# Patient Record
Sex: Female | Born: 1935 | Race: White | Hispanic: No | Smoking: Never smoker
Health system: Southern US, Community
[De-identification: ages and names within clinical notes are randomized; demographics above are authoritative.]

## PROBLEM LIST (undated history)

## (undated) DIAGNOSIS — N390 Urinary tract infection, site not specified: Secondary | ICD-10-CM

## (undated) DIAGNOSIS — Z87442 Personal history of urinary calculi: Secondary | ICD-10-CM

## (undated) DIAGNOSIS — M1990 Unspecified osteoarthritis, unspecified site: Secondary | ICD-10-CM

## (undated) DIAGNOSIS — M1710 Unilateral primary osteoarthritis, unspecified knee: Secondary | ICD-10-CM

## (undated) DIAGNOSIS — G20 Parkinson's disease: Secondary | ICD-10-CM

## (undated) DIAGNOSIS — N8110 Cystocele, unspecified: Secondary | ICD-10-CM

## (undated) DIAGNOSIS — K259 Gastric ulcer, unspecified as acute or chronic, without hemorrhage or perforation: Secondary | ICD-10-CM

## (undated) DIAGNOSIS — G43909 Migraine, unspecified, not intractable, without status migrainosus: Secondary | ICD-10-CM

## (undated) DIAGNOSIS — I10 Essential (primary) hypertension: Secondary | ICD-10-CM

## (undated) DIAGNOSIS — N200 Calculus of kidney: Secondary | ICD-10-CM

## (undated) DIAGNOSIS — K5900 Constipation, unspecified: Secondary | ICD-10-CM

## (undated) HISTORY — DX: Cystocele, unspecified: N81.10

## (undated) HISTORY — DX: Essential (primary) hypertension: I10

## (undated) HISTORY — DX: Constipation, unspecified: K59.00

## (undated) HISTORY — DX: Urinary tract infection, site not specified: N39.0

## (undated) HISTORY — PX: CHOLECYSTECTOMY: SHX55

## (undated) HISTORY — PX: BLADDER SURGERY: SHX569

## (undated) HISTORY — DX: Parkinson's disease: G20

## (undated) HISTORY — DX: Calculus of kidney: N20.0

## (undated) HISTORY — DX: Gastric ulcer, unspecified as acute or chronic, without hemorrhage or perforation: K25.9

## (undated) HISTORY — DX: Unspecified osteoarthritis, unspecified site: M19.90

## (undated) HISTORY — DX: Hypercalcemia: E83.52

## (undated) HISTORY — PX: BREAST CYST ASPIRATION: SHX578

## (undated) HISTORY — DX: Unilateral primary osteoarthritis, unspecified knee: M17.10

## (undated) HISTORY — DX: Personal history of urinary calculi: Z87.442

## (undated) HISTORY — DX: Migraine, unspecified, not intractable, without status migrainosus: G43.909

## (undated) HISTORY — PX: PARTIAL HYSTERECTOMY: SHX80

## (undated) HISTORY — PX: KIDNEY STONE SURGERY: SHX686

---

## 1984-10-18 HISTORY — PX: BREAST EXCISIONAL BIOPSY: SUR124

## 2005-01-14 ENCOUNTER — Ambulatory Visit: Payer: Self-pay | Admitting: Internal Medicine

## 2005-12-08 ENCOUNTER — Ambulatory Visit: Payer: Self-pay | Admitting: Internal Medicine

## 2005-12-29 ENCOUNTER — Ambulatory Visit: Payer: Self-pay | Admitting: Internal Medicine

## 2006-02-10 ENCOUNTER — Ambulatory Visit: Payer: Self-pay | Admitting: Internal Medicine

## 2006-02-11 ENCOUNTER — Ambulatory Visit: Payer: Self-pay | Admitting: Internal Medicine

## 2006-02-21 ENCOUNTER — Ambulatory Visit: Payer: Self-pay | Admitting: Internal Medicine

## 2007-02-08 IMAGING — US ULTRASOUND LEFT BREAST
1 series · 15 of 15 positions shown · non-contrast
Comparison: none

REASON FOR EXAM: Mass lesion  US PRN
COMMENTS:

PROCEDURE:     US  - US BREAST LEFT  - February 21, 2006 [DATE]
RESULT:     Comparison is made to the patient's prior mammogram of 02/10/06.
The large LEFT breast mass represents a simple cyst.  No further evaluation
is needed and yearly follow-up mammograms can be obtained.

[Series 1: ultrasound left breast · 15 of 15 slices shown]
[im 1/15]
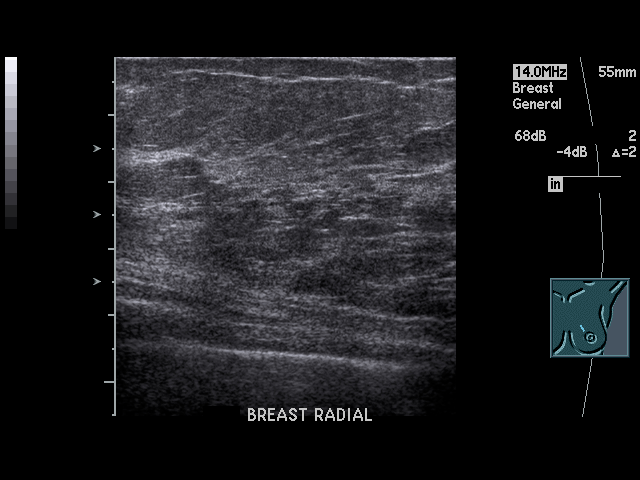
[im 2/15]
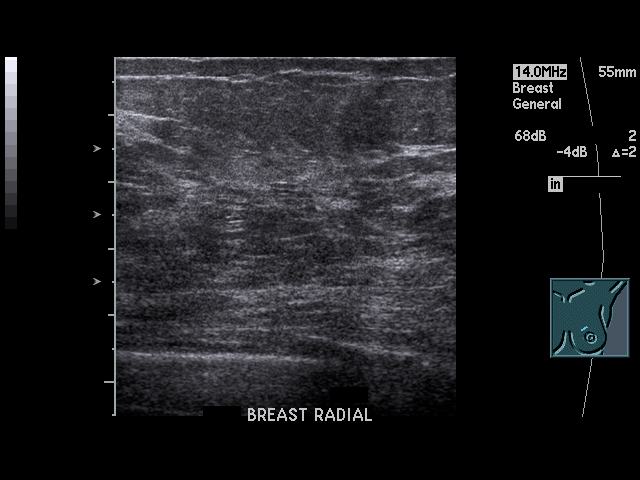
[im 3/15]
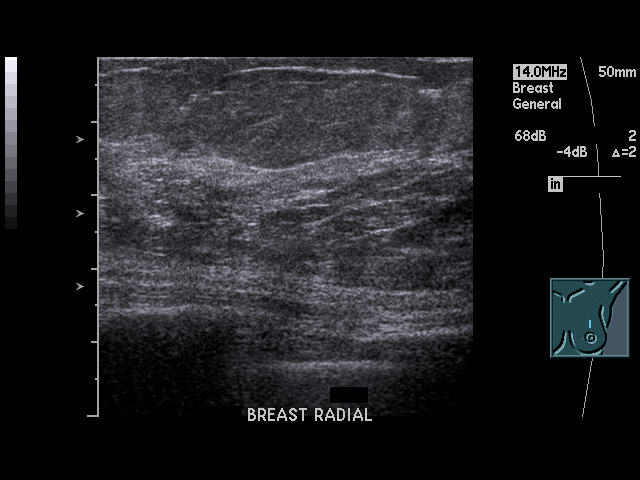
[im 4/15]
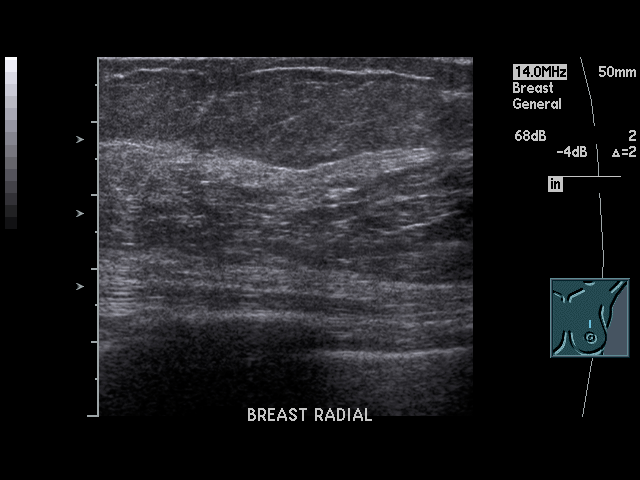
[im 5/15]
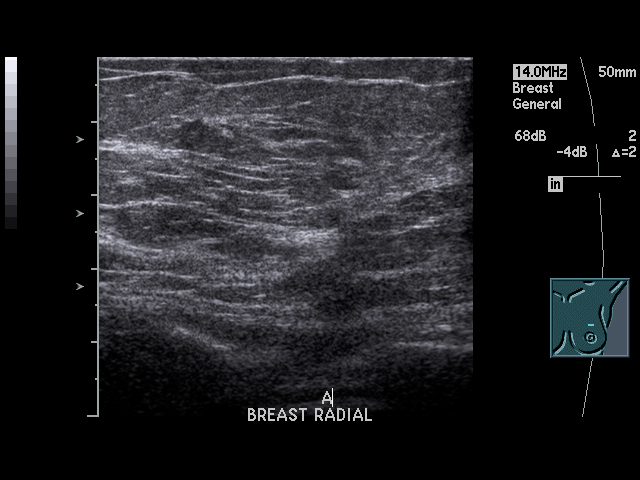
[im 6/15]
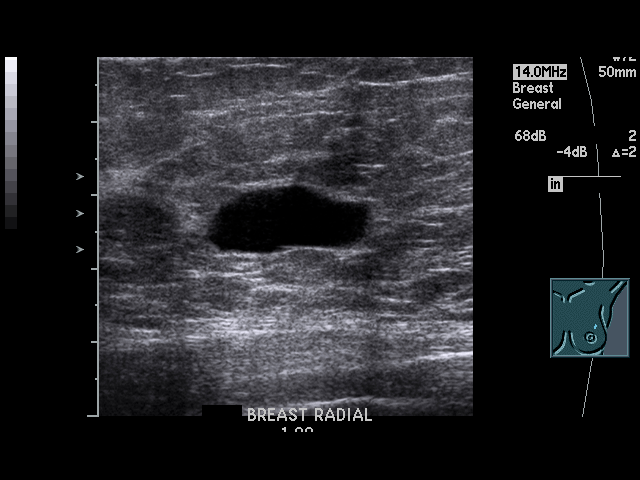
[im 7/15]
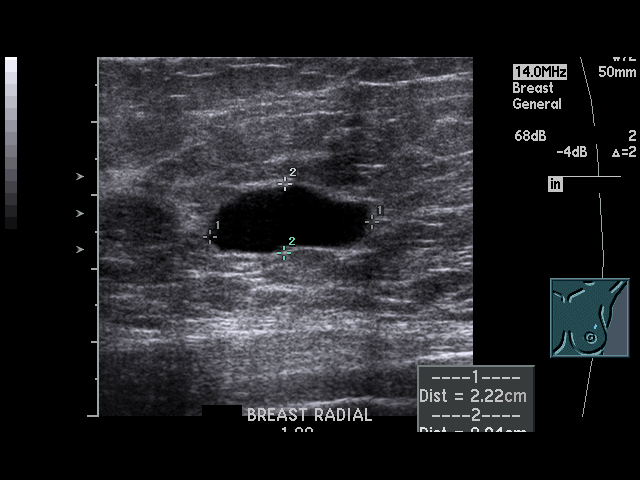
[im 8/15]
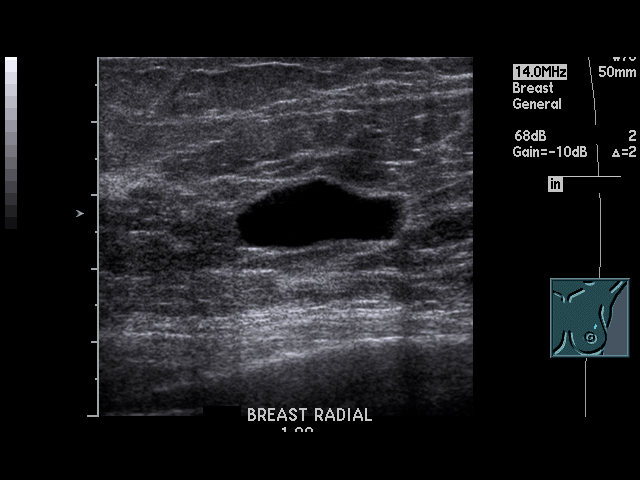
[im 9/15]
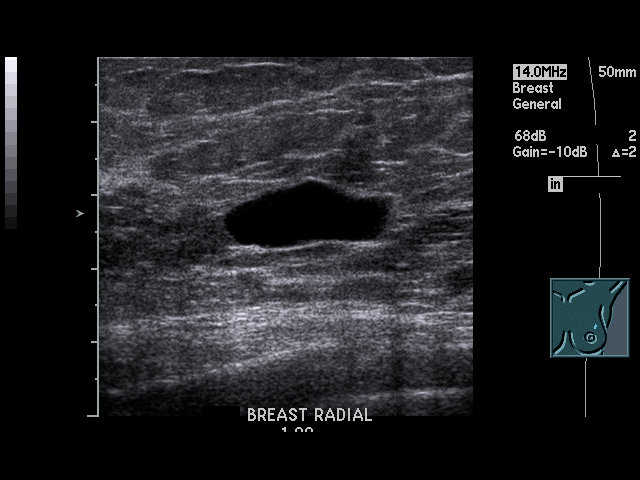
[im 10/15]
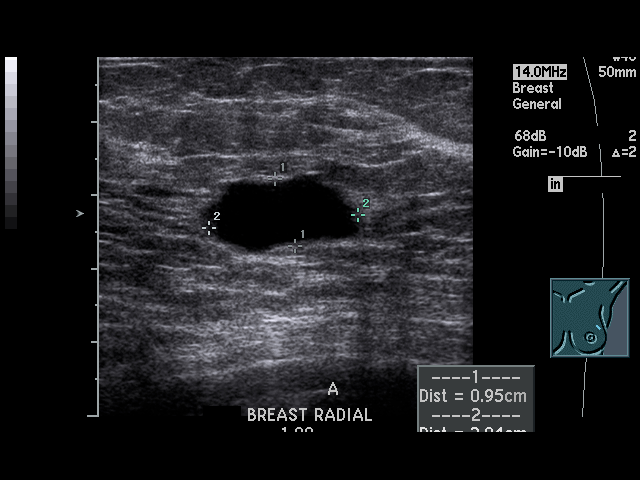
[im 11/15]
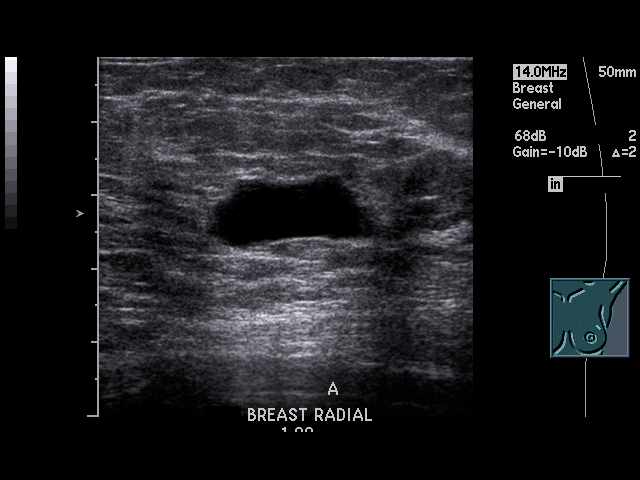
[im 12/15]
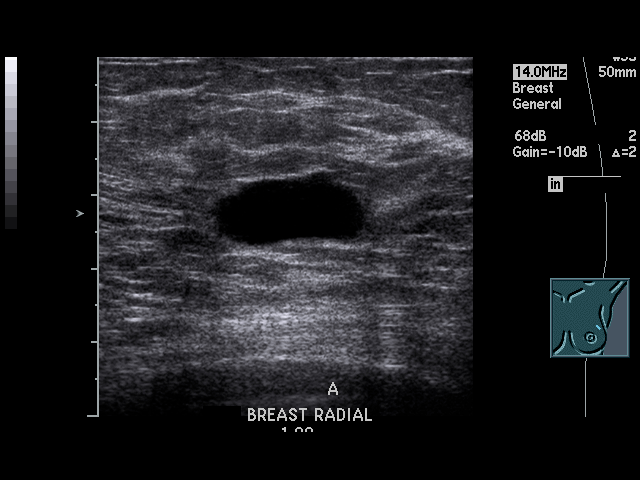
[im 13/15]
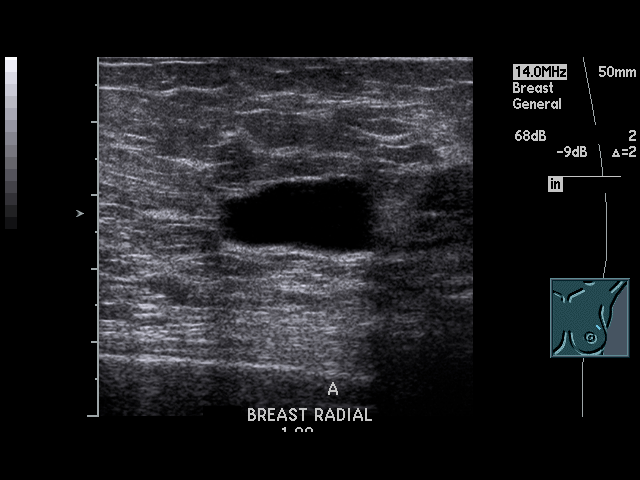
[im 14/15]
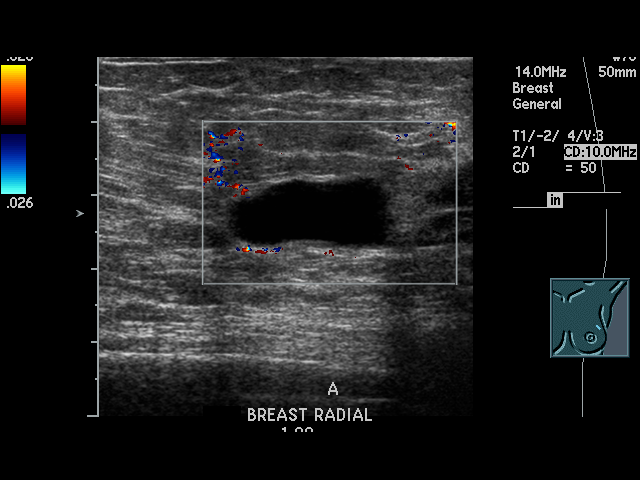
[im 15/15]
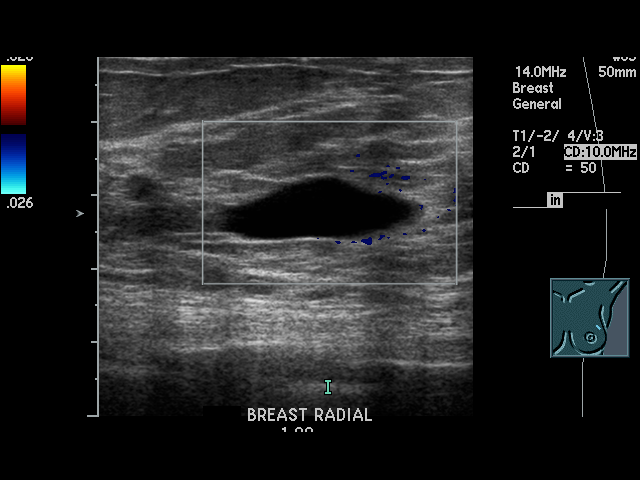

[15 of 15 positions shown; findings below may reference images not displayed]

IMPRESSION: Simple cyst LEFT breast. Yearly follow-up mammogram is suggested.

BI-RADS: Category 2 - Benign Finding.

A NEGATIVE MAMMOGRAM REPORT DOES NOT PRECLUDE BIOPSY OR OTHER EVALUATION OF
A CLINICALLY PALPABLE OR OTHERWISE SUSPICIOUS MASS OR LESION.  BREAST CANCER
MAY NOT BE DETECTED BY MAMMOGRAPHY IN UP TO 10% OF CASES.

## 2007-02-14 ENCOUNTER — Ambulatory Visit: Payer: Self-pay | Admitting: Internal Medicine

## 2007-02-16 ENCOUNTER — Ambulatory Visit: Payer: Self-pay | Admitting: Internal Medicine

## 2007-08-30 ENCOUNTER — Ambulatory Visit: Payer: Self-pay | Admitting: Internal Medicine

## 2007-09-01 ENCOUNTER — Ambulatory Visit: Payer: Self-pay | Admitting: Internal Medicine

## 2007-12-29 ENCOUNTER — Ambulatory Visit: Payer: Self-pay | Admitting: Internal Medicine

## 2008-02-13 ENCOUNTER — Ambulatory Visit: Payer: Self-pay | Admitting: Gastroenterology

## 2008-02-19 ENCOUNTER — Ambulatory Visit: Payer: Self-pay | Admitting: Internal Medicine

## 2008-04-04 ENCOUNTER — Ambulatory Visit: Payer: Self-pay | Admitting: General Surgery

## 2008-04-08 ENCOUNTER — Other Ambulatory Visit: Payer: Self-pay

## 2008-04-08 ENCOUNTER — Ambulatory Visit: Payer: Self-pay | Admitting: General Surgery

## 2008-04-30 ENCOUNTER — Encounter: Admission: RE | Admit: 2008-04-30 | Discharge: 2008-04-30 | Payer: Self-pay | Admitting: Neurology

## 2008-06-12 ENCOUNTER — Ambulatory Visit: Payer: Self-pay | Admitting: Surgery

## 2008-06-20 ENCOUNTER — Ambulatory Visit: Payer: Self-pay | Admitting: Surgery

## 2009-02-21 ENCOUNTER — Ambulatory Visit: Payer: Self-pay | Admitting: Internal Medicine

## 2009-06-12 ENCOUNTER — Ambulatory Visit: Payer: Self-pay | Admitting: Internal Medicine

## 2010-02-23 ENCOUNTER — Ambulatory Visit: Payer: Self-pay | Admitting: Internal Medicine

## 2010-03-09 ENCOUNTER — Ambulatory Visit: Payer: Self-pay | Admitting: Internal Medicine

## 2010-09-02 ENCOUNTER — Ambulatory Visit: Payer: Self-pay | Admitting: Internal Medicine

## 2010-10-02 ENCOUNTER — Ambulatory Visit: Payer: Self-pay | Admitting: Unknown Physician Specialty

## 2010-12-15 ENCOUNTER — Ambulatory Visit: Payer: Self-pay | Admitting: Unknown Physician Specialty

## 2010-12-22 ENCOUNTER — Encounter: Payer: Self-pay | Admitting: Rheumatology

## 2011-05-20 ENCOUNTER — Inpatient Hospital Stay: Payer: Self-pay | Admitting: Internal Medicine

## 2011-06-17 ENCOUNTER — Ambulatory Visit: Payer: Self-pay | Admitting: Internal Medicine

## 2011-08-03 ENCOUNTER — Ambulatory Visit: Payer: Self-pay | Admitting: Internal Medicine

## 2012-06-20 ENCOUNTER — Ambulatory Visit: Payer: Self-pay | Admitting: Internal Medicine

## 2012-07-03 DIAGNOSIS — N993 Prolapse of vaginal vault after hysterectomy: Secondary | ICD-10-CM | POA: Insufficient documentation

## 2012-07-03 DIAGNOSIS — R339 Retention of urine, unspecified: Secondary | ICD-10-CM | POA: Insufficient documentation

## 2012-07-03 DIAGNOSIS — N8111 Cystocele, midline: Secondary | ICD-10-CM | POA: Insufficient documentation

## 2012-07-03 DIAGNOSIS — N3946 Mixed incontinence: Secondary | ICD-10-CM | POA: Insufficient documentation

## 2012-09-29 ENCOUNTER — Inpatient Hospital Stay: Payer: Self-pay | Admitting: Internal Medicine

## 2012-09-29 LAB — CBC
HGB: 12.1 g/dL (ref 12.0–16.0)
MCH: 28.7 pg (ref 26.0–34.0)
MCHC: 33.7 g/dL (ref 32.0–36.0)
Platelet: 315 10*3/uL (ref 150–440)
RBC: 4.23 10*6/uL (ref 3.80–5.20)

## 2012-09-29 LAB — COMPREHENSIVE METABOLIC PANEL
Bilirubin,Total: 0.3 mg/dL (ref 0.2–1.0)
Calcium, Total: 8.9 mg/dL (ref 8.5–10.1)
Co2: 27 mmol/L (ref 21–32)
EGFR (African American): >60
Glucose: 115 mg/dL — ABNORMAL HIGH (ref 65–99)
Potassium: 3.5 mmol/L (ref 3.5–5.1)
SGOT(AST): 24 U/L (ref 15–37)
Sodium: 142 mmol/L (ref 136–145)

## 2012-09-29 LAB — APTT: Activated PTT: 25.8 secs (ref 23.6–35.9)

## 2012-09-29 LAB — CK TOTAL AND CKMB (NOT AT ARMC)
CK, Total: 58 U/L (ref 21–215)
CK-MB: 1 ng/mL (ref 0.5–3.6)

## 2012-09-29 LAB — PROTIME-INR: INR: 0.9

## 2012-09-29 LAB — TROPONIN I: Troponin-I: <0.02 ng/mL

## 2012-09-30 LAB — CBC WITH DIFFERENTIAL/PLATELET
Eosinophil #: 0.1 10*3/uL (ref 0.0–0.7)
MCH: 28.2 pg (ref 26.0–34.0)
MCHC: 33.2 g/dL (ref 32.0–36.0)
Monocyte #: 0.8 x10 3/mm (ref 0.2–0.9)
Neutrophil %: 66.5 %
Platelet: 262 10*3/uL (ref 150–440)

## 2012-09-30 LAB — BASIC METABOLIC PANEL
Calcium, Total: 8 mg/dL — ABNORMAL LOW (ref 8.5–10.1)
Chloride: 113 mmol/L — ABNORMAL HIGH (ref 98–107)
EGFR (Non-African Amer.): >60
Glucose: 93 mg/dL (ref 65–99)
Osmolality: 296 (ref 275–301)
Potassium: 3.8 mmol/L (ref 3.5–5.1)
Sodium: 145 mmol/L (ref 136–145)

## 2012-10-01 LAB — CBC WITH DIFFERENTIAL/PLATELET
Basophil #: 0 10*3/uL (ref 0.0–0.1)
HGB: 9.7 g/dL — ABNORMAL LOW (ref 12.0–16.0)
Lymphocyte #: 2 10*3/uL (ref 1.0–3.6)
MCH: 28.5 pg (ref 26.0–34.0)
MCHC: 33.3 g/dL (ref 32.0–36.0)
Monocyte %: 10.1 %
RDW: 15.1 % — ABNORMAL HIGH (ref 11.5–14.5)

## 2012-10-02 LAB — CBC WITH DIFFERENTIAL/PLATELET
Basophil #: 0 10*3/uL (ref 0.0–0.1)
Eosinophil #: 0.1 10*3/uL (ref 0.0–0.7)
Lymphocyte %: 23.1 %
MCH: 29.3 pg (ref 26.0–34.0)
MCHC: 34.2 g/dL (ref 32.0–36.0)
Monocyte #: 0.7 x10 3/mm (ref 0.2–0.9)
Neutrophil %: 65.6 %
Platelet: 262 10*3/uL (ref 150–440)
RDW: 15.3 % — ABNORMAL HIGH (ref 11.5–14.5)

## 2012-10-02 LAB — BASIC METABOLIC PANEL
Anion Gap: 5 — ABNORMAL LOW (ref 7–16)
BUN: 8 mg/dL (ref 7–18)
Chloride: 115 mmol/L — ABNORMAL HIGH (ref 98–107)
Co2: 25 mmol/L (ref 21–32)
Osmolality: 286 (ref 275–301)

## 2012-10-02 LAB — URIC ACID: Uric Acid: 3.6 mg/dL (ref 2.6–6.0)

## 2012-10-03 LAB — CBC WITH DIFFERENTIAL/PLATELET
Basophil #: 0 10*3/uL (ref 0.0–0.1)
Basophil %: 0.3 %
Eosinophil #: 0.2 10*3/uL (ref 0.0–0.7)
HGB: 10.2 g/dL — ABNORMAL LOW (ref 12.0–16.0)
Lymphocyte %: 22.3 %
MCH: 28.2 pg (ref 26.0–34.0)
MCHC: 33.3 g/dL (ref 32.0–36.0)
Monocyte #: 0.8 x10 3/mm (ref 0.2–0.9)
Neutrophil %: 66.4 %
RDW: 15.4 % — ABNORMAL HIGH (ref 11.5–14.5)

## 2012-10-03 LAB — BASIC METABOLIC PANEL
Anion Gap: 5 — ABNORMAL LOW (ref 7–16)
BUN: 10 mg/dL (ref 7–18)
Chloride: 112 mmol/L — ABNORMAL HIGH (ref 98–107)
Creatinine: 0.85 mg/dL (ref 0.60–1.30)
Potassium: 3.6 mmol/L (ref 3.5–5.1)
Sodium: 141 mmol/L (ref 136–145)

## 2013-05-01 ENCOUNTER — Ambulatory Visit: Payer: Self-pay | Admitting: Gastroenterology

## 2013-05-03 LAB — PATHOLOGY REPORT

## 2013-06-21 ENCOUNTER — Ambulatory Visit: Payer: Self-pay | Admitting: Internal Medicine

## 2013-06-26 ENCOUNTER — Encounter: Payer: Self-pay | Admitting: Neurology

## 2013-06-26 ENCOUNTER — Ambulatory Visit (INDEPENDENT_AMBULATORY_CARE_PROVIDER_SITE_OTHER): Payer: Medicare Other | Admitting: Neurology

## 2013-06-26 VITALS — BP 138/77 | HR 101 | Ht 64.0 in | Wt 151.0 lb

## 2013-06-26 DIAGNOSIS — G20A1 Parkinson's disease without dyskinesia, without mention of fluctuations: Secondary | ICD-10-CM

## 2013-06-26 DIAGNOSIS — G20 Parkinson's disease: Secondary | ICD-10-CM

## 2013-06-26 HISTORY — DX: Parkinson's disease without dyskinesia, without mention of fluctuations: G20.A1

## 2013-06-26 HISTORY — DX: Parkinson's disease: G20

## 2013-06-26 NOTE — Progress Notes (Signed)
History of Present Illness:   [name]  is a 79 year-old right handed, Caucasian female with past medical history of hypertension, depression, anxiety, hypercalcemia, and  idiopathic Parkinson disease.    She presented with mild difficulty walking, and difficulty bearing weight with her left leg alone, but continued to ambulate without assistance, also exercise regularly  MRI of the brain - mild periventricular white-matter disease without difficulty.  She has been tolerating sinemet 25/100 one tablet t.i.d., there was no significant side effects, it does help her walking better  Her son who has been quite ill with Von-Hippel Lindau disease, planning on to have cervical decompression surgery at [facility] soon.    She lives in an assisted living, driving, no memory trouble, last visit was in March 2014, she ambulate without difficulty   Physical Exam  General: well developed, well nourished elderly female, seated, in no evident distress.  Head: head  normocephalic and atraumatic.  Oropharynx benign. Neck: supple, no bruise Cardiovascular: regular rate and rhythm, no murmurs Trunk: normal alignment and mobility, no deformity  Neurologic Exam  Mental Status: Awake and fully alert.  Oriented to place and time.   Cranial Nerves:   Pupils equal, briskly reactive to light.  Extraocular movements full without nystagmus.  Visual fields full to confrontation.  Hearing intact and symmetric to finger rub.  Facial sensation intact.  Face, tongue, palate move normally and symmetrically.  Neck flexion and extension normal.  Negative Myerson's sign. Motor: God strength in the upper and lower extremities, no rigidity,  Coordination: no dysmetria, good finger to nose, heel to shin Gait and Station: Arises from chair without use of the arms, nodifficulty.  Stance is narrow based. smooth turning, moderate stride, mildly decreased arm swing and asymmetry of the right shoulder when ambulating.   Reflexes:  symmetric    Assessment and Plan:  [name]  is a 79 year old right handed, Caucasian female with past medical history of hypertension, depression, anxiety, hypercalcemia, and  idiopathic Parkinson disease.  She is taking and tolerating Sinemet 25/100 i tid,   Refill medications, return in clinic in one year

## 2013-12-11 ENCOUNTER — Ambulatory Visit: Payer: Self-pay | Admitting: Urology

## 2013-12-11 LAB — BASIC METABOLIC PANEL
Anion Gap: 6 — ABNORMAL LOW (ref 7–16)
BUN: 18 mg/dL (ref 7–18)
Calcium, Total: 9 mg/dL (ref 8.5–10.1)
Chloride: 107 mmol/L (ref 98–107)
Co2: 27 mmol/L (ref 21–32)
Creatinine: 1.01 mg/dL (ref 0.60–1.30)
GFR CALC NON AF AMER: 52 — AB
Glucose: 96 mg/dL (ref 65–99)
Osmolality: 281 (ref 275–301)
Potassium: 4.1 mmol/L (ref 3.5–5.1)
SODIUM: 140 mmol/L (ref 136–145)

## 2013-12-11 LAB — CBC
HCT: 45.6 % (ref 35.0–47.0)
HGB: 14.6 g/dL (ref 12.0–16.0)
MCH: 27.8 pg (ref 26.0–34.0)
MCHC: 32 g/dL (ref 32.0–36.0)
MCV: 87 fL (ref 80–100)
PLATELETS: 288 10*3/uL (ref 150–440)
RBC: 5.24 10*6/uL — ABNORMAL HIGH (ref 3.80–5.20)
RDW: 14.1 % (ref 11.5–14.5)
WBC: 8.5 10*3/uL (ref 3.6–11.0)

## 2013-12-18 ENCOUNTER — Ambulatory Visit: Payer: Self-pay | Admitting: Obstetrics and Gynecology

## 2013-12-19 LAB — HEMOGLOBIN: HGB: 13.4 g/dL (ref 12.0–16.0)

## 2013-12-25 DIAGNOSIS — N390 Urinary tract infection, site not specified: Secondary | ICD-10-CM | POA: Insufficient documentation

## 2014-01-18 DIAGNOSIS — N393 Stress incontinence (female) (male): Secondary | ICD-10-CM | POA: Insufficient documentation

## 2014-02-28 DIAGNOSIS — R7309 Other abnormal glucose: Secondary | ICD-10-CM | POA: Insufficient documentation

## 2014-05-02 DIAGNOSIS — I10 Essential (primary) hypertension: Secondary | ICD-10-CM | POA: Insufficient documentation

## 2014-05-02 DIAGNOSIS — M1710 Unilateral primary osteoarthritis, unspecified knee: Secondary | ICD-10-CM | POA: Insufficient documentation

## 2014-05-02 DIAGNOSIS — M810 Age-related osteoporosis without current pathological fracture: Secondary | ICD-10-CM | POA: Insufficient documentation

## 2014-05-02 DIAGNOSIS — Z87898 Personal history of other specified conditions: Secondary | ICD-10-CM | POA: Insufficient documentation

## 2014-05-02 DIAGNOSIS — T7840XA Allergy, unspecified, initial encounter: Secondary | ICD-10-CM | POA: Insufficient documentation

## 2014-05-02 DIAGNOSIS — Z87442 Personal history of urinary calculi: Secondary | ICD-10-CM | POA: Insufficient documentation

## 2014-05-02 DIAGNOSIS — M179 Osteoarthritis of knee, unspecified: Secondary | ICD-10-CM

## 2014-05-02 DIAGNOSIS — N6019 Diffuse cystic mastopathy of unspecified breast: Secondary | ICD-10-CM | POA: Insufficient documentation

## 2014-05-02 HISTORY — DX: Essential (primary) hypertension: I10

## 2014-05-02 HISTORY — DX: Osteoarthritis of knee, unspecified: M17.9

## 2014-05-02 HISTORY — DX: Unilateral primary osteoarthritis, unspecified knee: M17.10

## 2014-06-25 ENCOUNTER — Ambulatory Visit: Payer: Self-pay | Admitting: Internal Medicine

## 2014-06-26 ENCOUNTER — Encounter (INDEPENDENT_AMBULATORY_CARE_PROVIDER_SITE_OTHER): Payer: Self-pay

## 2014-06-26 ENCOUNTER — Encounter: Payer: Self-pay | Admitting: Neurology

## 2014-06-26 ENCOUNTER — Ambulatory Visit (INDEPENDENT_AMBULATORY_CARE_PROVIDER_SITE_OTHER): Payer: Medicare Other | Admitting: Neurology

## 2014-06-26 VITALS — BP 156/85 | HR 84 | Ht 64.0 in | Wt 152.0 lb

## 2014-06-26 DIAGNOSIS — G20 Parkinson's disease: Secondary | ICD-10-CM

## 2014-06-26 MED ORDER — CARBIDOPA-LEVODOPA 25-100 MG PO TABS: 1.0000 | ORAL_TABLET | Freq: Three times a day (TID) | ORAL | Status: DC

## 2014-06-26 NOTE — Progress Notes (Signed)
History of Present Illness:   Karen Manning  is a 80 year-old right handed, Caucasian female with past medical history of hypertension, depression, anxiety, hypercalcemia, and  idiopathic Parkinson disease.    She presented with mild difficulty walking, and difficulty bearing weight with her left leg alone, but continued to ambulate without assistance, also exercise regularly  MRI of the brain - mild periventricular white-matter disease without difficulty.  She has been tolerating sinemet 25/100 one tablet t.i.d., there was no significant side effects, it does help her walking better  Her son who has been quite ill with Von-Hippel Lindau disease, planning on to have cervical decompression surgery at [facility] soon.    She lives in an assisted living, driving, no memory trouble, last visit was in March 2014, she ambulate without difficulty  UPDATE Sep 9th 2015: She exercise 3 times a week, no significant gait difficulty, toes paresthesia, numb burning pain.  She continues to take Sinemet 25/100 mg 1 tablet 3 times a day, was not sure that it is helping her, she denied memory trouble, continued to drive, visiting her son regularly   Physical Exam  General: well developed, well nourished elderly female, seated, in no evident distress.  Head: head  normocephalic and atraumatic.  Oropharynx benign. Neck: supple, no bruise Cardiovascular: regular rate and rhythm, no murmurs Trunk: normal alignment and mobility, no deformity  Neurologic Exam  Mental Status: Awake and fully alert.  Oriented to place and time.   Cranial Nerves:   Pupils equal, briskly reactive to light.  Extraocular movements full without nystagmus.  Visual fields full to confrontation.  Hearing intact and symmetric to finger rub.  Facial sensation intact.  Face, tongue, palate move normally and symmetrically.  Neck flexion and extension normal.  Negative Myerson's sign. Motor: Good strength in the upper and lower extremities, she has  mild right more than left arm rigidity, Coordination: no dysmetria, good finger to nose, heel to shin Gait and Station: Arises from chair without use of the arms, nodifficulty.  Stance is narrow based. smooth turning, moderate stride, mildly decreased arm swing and asymmetry of the right shoulder when ambulating.   Reflexes: symmetric    Assessment and Plan:  Karen Manning  is a 80 year old right handed, Caucasian female with past medical history of hypertension, depression, anxiety, hypercalcemia, was followed by our clinic for tremor.  She is taking and tolerating Sinemet 25/100 i tid,   there was no significant progression of her tremor, rigidity, no significant gait difficulty.  Her clinical history, very stable course, does not fit the typical idiopathic Parkinson's disease, I have suggested her to gradually tapering off sinemet to 1/2 tablet 3 times a day, even wean herself off.  If there was no significant change, stay off sinemet, return in one year.

## 2015-02-04 NOTE — Consult Note (Signed)
Chief Complaint:   Subjective/Chief Complaint Feels ok. Feels constipated. Small amount of old blood passed this AM. Hgb sl down from yest.   VITAL SIGNS/ANCILLARY NOTES: **Vital Signs.:   15-Dec-13 05:04   Vital Signs Type Q 4hr   Temperature Temperature (F) 98.5   Celsius 36.9   Temperature Source Oral   Pulse Pulse 78   Respirations Respirations 19   Systolic BP Systolic BP 697   Diastolic BP (mmHg) Diastolic BP (mmHg) 64   Mean BP 86   Pulse Ox % Pulse Ox % 94   Pulse Ox Activity Level  At rest   Oxygen Delivery Room Air/ 21 %   Brief Assessment:   Cardiac Regular    Respiratory clear BS    Gastrointestinal Normal   Lab Results: Routine Chem:  14-Dec-13 03:10    Glucose, Serum 93   BUN  34   Creatinine (comp) 0.79   Sodium, Serum 145   Potassium, Serum 3.8   Chloride, Serum  113   CO2, Serum 26   Calcium (Total), Serum  8.0   Anion Gap  6   Osmolality (calc) 296   eGFR (African American) >60   eGFR (Non-African American) >60 (eGFR values <56m/min/1.73 m2 may be an indication of chronic kidney disease (CKD). Calculated eGFR is useful in patients with stable renal function. The eGFR calculation will not be reliable in acutely ill patients when serum creatinine is changing rapidly. It is not useful in  patients on dialysis. The eGFR calculation may not be applicable to patients at the low and high extremes of body sizes, pregnant women, and vegetarians.)  Routine Hem:  14-Dec-13 03:10    WBC (CBC) 9.8   RBC (CBC)  3.55   Hemoglobin (CBC)  10.0   Hematocrit (CBC)  30.2   Platelet Count (CBC) 262   MCV 85   MCH 28.2   MCHC 33.2   RDW  15.2   Neutrophil % 66.5   Lymphocyte % 23.5   Monocyte % 7.9   Eosinophil % 1.5   Basophil % 0.6   Neutrophil # 6.5   Lymphocyte # 2.3   Monocyte # 0.8   Eosinophil # 0.1   Basophil # 0.1 (Result(s) reported on 30 Sep 2012 at 04:28AM.)   Assessment/Plan:  Assessment/Plan:   Assessment GI bleeding. Stopped.     Plan Will proceed with EGD tomorrow AM. If no further bleeding, possible discharge by tomorrow afternoon. Thanks.   Electronic Signatures: OVerdie Shire(MD)  (Signed 15-Dec-13 10:34)  Authored: Chief Complaint, VITAL SIGNS/ANCILLARY NOTES, Brief Assessment, Lab Results, Assessment/Plan   Last Updated: 15-Dec-13 10:34 by OVerdie Shire(MD)

## 2015-02-04 NOTE — H&P (Signed)
PATIENT NAME:  Karen Manning, Karen Manning MR#:  [id number] DATE OF BIRTH:  Sep 02, 1930  DATE OF ADMISSION:  09/29/2012  REFERRING PHYSICIAN: Dr. Clemens Catholic.   REASON FOR ADMISSION: Gastrointestinal bleed.   HISTORY OF PRESENT ILLNESS: The patient is an 81 year old female with a history of multiple medical problems including migraine headaches, osteoarthritis, hypertension, and hyperlipidemia. Has a history of gastric ulcers with upper GI bleeds in the past. Presents now with melena and weakness. In the emergency room, the patient was noted to be nauseated with an elevated BUN. Stool was black, guaiac-positive. She is now admitted for further evaluation.   PAST MEDICAL HISTORY:  1. History of gastric ulcers with upper GI bleed.  2. Hyperlipidemia.  3. Benign hypertension.  4. Migraine headaches.  5. Osteoarthritis.  6. Status post parathyroidectomy.  7. Status post cataract surgery.  8. History of nephrolithiasis.  9. Status post cholecystectomy.  10. Status post hysterectomy.   MEDICATIONS:  1. Sinemet 25/100 one p.o. b.i.d.  2. Neurontin 300 mg p.o. b.i.d.  3. K-Dur 10 mEq p.o. daily.  4. Ativan 1 mg p.o. at bedtime.  5. Flomax 0.4 mg p.o. b.i.d.  6. Vitamin D 1000 units p.o. daily.  7. Aspirin 81 mg p.o. daily.   ALLERGIES: Morphine, penicillin, Keflex, and Aleve.   SOCIAL HISTORY: The patient is widowed. No history of alcohol or tobacco abuse.   FAMILY HISTORY: Positive for hypertension and coronary artery disease. Negative for breast or colon cancer.   REVIEW OF SYSTEMS. CONSTITUTIONAL: No fever or change in weight. EYES: No blurred or double vision. No glaucoma. ENT: No tinnitus or hearing loss. No nasal discharge or bleeding. No difficulty swallowing. RESPIRATORY: No cough or wheezing. Denies hemoptysis. No painful respiration. CARDIOVASCULAR: No chest pain or orthopnea. No palpitations. No syncope. GI: No vomiting or diarrhea. No abdominal pain.  GU: No dysuria or hematuria.  Does have  incontinence. ENDOCRINE: No polyuria or polydipsia.  No heat or cold intolerance.  HEMATOLOGIC: Patient denies anemia, easy bruising.  LYMPHATIC: No swollen glands.  MUSCULOSKELETAL: Patient denies pain in her neck, back, shoulders, knees, or hips.  No gout.  NEUROLOGIC: No numbness.  No recent migraines.  Denies stroke or seizures.  PSYCHIATRIC: Patient denies anxiety, insomnia, or depression.    PHYSICAL EXAMINATION:    GENERAL:  The patient is in no acute distress.    VITAL SIGNS: Remarkable for a blood pressure of 174/78 with a heart rate of  79, respiratory rate of 18.  She is afebrile.    HEENT:  Normocephalic, atraumatic.  Pupils equally round and reactive to light and accommodation.  Extraocular movements are intact.  Sclerae are nonicteric.  Conjunctivae are clear.  Oropharynx is clear.    NECK:  Supple without JVD or bruits.  No lymphadenopathy or thyromegaly is noted.    LUNGS: Clear to auscultation and percussion without wheezes, rales, or rhonchi.  No dullness.    CARDIAC:  Regular rate and rhythm with normal S1 and S2.  No significant rubs, murmurs, or gallops.  PMI is nondisplaced.  Chest wall is nontender.    ABDOMEN:  Soft and nontender with normoactive bowel sounds.  No organomegaly or masses were appreciated.  No hernias or bruits were noted.    RECTAL: Stool was guaiac positive per the emergency room physician.    EXTREMITIES:  Without clubbing, cyanosis, edema.  Pulses were 2+ bilaterally.    SKIN:  Warm and dry without rash or lesions.    NEUROLOGIC:  Cranial nerves II-XII  grossly intact.  Deep tendon reflexes were symmetric.  Motor and sensory exams nonfocal.    PSYCHIATRIC:  Psychiatric exam revealed a patient who is alert and oriented to person, place, and time.  She was cooperative and uses good judgment.    LABORATORY DATA: Troponin was less than 0.02. Glucose was 115 with a BUN of 41, a creatinine of 0.93 and a GFR of 58. Sodium was 142 with a potassium of 3.5.  White count 10.8 with a hemoglobin of 12.1.   ASSESSMENT:  1. Gastrointestinal bleed.  2. Generalized weakness and fatigue.  3. Dehydration.  4. Nausea.  5. Benign hypertension.  6. Migraine headaches.  7. Osteoarthritis.   PLAN: The patient will be admitted to the floor with IV Protonix drip. She will be on clear liquids. We will guaiac stools and follow her hemoglobin closely. We will obtain a bleeding scan through Nuclear Medicine and consult GI. We will continue her outpatient regimen for now. Further treatment and evaluation will depend upon the patient's progress.   TOTAL TIME SPENT ON THIS PATIENT: 50 minutes.    ____________________________ Duane LopeJeffrey D. Judithann SheenSparks, MD jds:vtd D: 09/29/2012 20:45:00 ET T: 09/30/2012 08:17:13 ET JOB#: [id number]  cc: Duane LopeJeffrey D. Judithann SheenSparks, MD, <Dictator> Josefita Weissmann Rodena Medin Armani Gawlik MD ELECTRONICALLY SIGNED 10/01/2012 14:34

## 2015-02-04 NOTE — Consult Note (Signed)
PATIENT NAME:  Karen Manning, Karen Manning MR#:  [id number] DATE OF BIRTH:  [birth year]  DATE OF CONSULTATION:  09/30/2012  REFERRING PHYSICIAN:   CONSULTING PHYSICIAN:  Karen Manning. Karen Kaufmann, MD  REASON FOR CONSULTATION: Melena.  HISTORY OF PRESENT ILLNESS: The patient is an 81 year old white female who started developing left wrist pain and swelling during a trip to [place] on Tuesday night. She ended up taking two ibuprofen because of the pain and swelling. By Thursday night she started to feel dizzy and lightheaded. This was then followed by two bouts of gross rectal bleeding. She describes the blood as more bright in nature. She also has some heartburn as well. She denied having any nausea, vomiting, fevers or chills. She then came back home yesterday afternoon. At home she had one bout of gross melena, in the afternoon. As a result, the patient came to the Emergency Room last night for further evaluation and then was admitted for management.   This morning she feels okay. She no longer feels lightheaded. She has not had any further bleeding since that time. Her initial hemoglobin was 12 last night, but now it is 10.   The patient does have a history of diffuse left-sided diverticulosis confirmed by colonoscopy in 2009. She had esophagitis and four gastric ulcers back in 2011. Gastroscopy was repeated in 2012. At that time, she had evidence of gastritis, but no ulcers. She just came back from bleeding scan which was negative.   PAST MEDICAL HISTORY: Notable for history of gastric ulcers. Other history includes hypertension, migraine headaches and arthritis. She also has hyperlipidemia.   PAST SURGICAL HISTORY: History includes parathyroidectomy, cataract surgery, kidney stones, cholecystectomy and hysterectomy.   MEDICATIONS: She takes a baby aspirin daily, vitamin, Flomax, Ativan, potassium, Neurontin and Sinemet. She does take some PPI, but only occasionally.   ALLERGIES: She is allergic to morphine,  penicillin, Keflex and Aleve.  SOCIAL HISTORY: She denies tobacco or alcohol use.   FAMILY HISTORY: Notable for coronary artery disease and hypertension.   REVIEW OF SYSTEMS: Please refer to Dr. Judithann Sheen' review of systems. There have not been any changes since being admitted.   PHYSICAL EXAMINATION:  GENERAL: The patient is in no acute distress.   VITALS: Her initial blood pressure was high at 174/78. She was afebrile.   HEAD/NECK: Normocephalic, atraumatic head. Pupils are equally reactive. Throat was clear.   NECK: Supple.   CARDIAC: Regular rhythm and rate without murmurs.   LUNGS: Clear bilaterally.   ABDOMEN: Normoactive bowel sounds, soft and nontender. There are no palpable masses. She had active bowel sounds.   RECTAL: Examination in the Emergency Room showed heme positive stool.   EXTREMITIES: No clubbing, cyanosis or edema.   SKIN: Negative.   NEUROLOGICAL: Examination is nonfocal.  LABS: Electrolytes were normal on admission. Her BUN is 41. This morning BUN is 34. Liver enzymes are normal. CPK enzymes were normal. White count was 10.8, hemoglobin 12.1 and now is 10.0, and white count is 9.8. PT and PTT are normal.   ASSESSMENT AND PLAN: This is a patient with drop in hemoglobin associated with anemia and heme-positive stool who does have a known history of gastric ulcers and gastritis as well as diverticulosis. A bleeding scan is negative. With recent ibuprofen use, we have to suspect upper gastrointestinal bleeding, although diverticular bleeding is also possible as well. We will continue to monitor her hemoglobin. She is on a Protonix drip at this point. We plan on doing an upper endoscopy  on Monday afternoon. If the upper endoscopy is negative, but the bleeding does not stop, then we may need to consider colonoscopy afterwards.   Thank you for the referral.  ____________________________ Karen StandingPaul Y. Karen Kaufmannh, MD pyo:sb D: 10/01/2012 09:07:00 ET T: 10/01/2012 09:56:04  ET JOB#: [id number]  cc: Karen StandingPaul Y. Karen Kaufmannh, MD, <Dictator> Karen StandingPAUL Y Stellar Gensel MD ELECTRONICALLY SIGNED 10/02/2012 9:31

## 2015-02-04 NOTE — Discharge Summary (Signed)
PATIENT NAME:  Karen FootmanSTARMER, Nico B MR#:  [id number] DATE OF BIRTH:  July 11, 1930  DATE OF ADMISSION:  09/29/2012 DATE OF DISCHARGE:  10/03/2012  REASON FOR ADMISSION: Gastrointestinal bleed.   HISTORY OF PRESENT ILLNESS: Please see the dictated HPI done by myself on 09/29/2012.   PAST MEDICAL HISTORY: 1. History of gastric ulcers with upper gastrointestinal bleed.  2. Benign hypertension.  3. Hyperlipidemia.  4. Migraine headaches.  5. Osteoarthritis.  6. History of nephrolithiasis.  7. Status post parathyroidectomy.  8. Status post cataract surgery.  9. Status post cholecystectomy.  10. Status post hysterectomy.   MEDICATIONS ON ADMISSION: Please see admission note.   ALLERGIES: Morphine, penicillin, Keflex and Aleve.   SOCIAL HISTORY, FAMILY HISTORY AND REVIEW OF SYSTEMS: As per admission note.   PHYSICAL EXAM: The patient was in no acute distress. Vital signs were stable and she was afebrile. HEENT exam was unremarkable. Neck was supple without JVD. Lungs were clear. Cardiac exam revealed a regular rate and rhythm with normal S1 and S2. Abdomen was soft and nontender with normoactive bowel sounds. No organomegaly or masses were appreciated. Extremities were without edema. Neurologic exam was grossly nonfocal.   HOSPITAL COURSE: The patient was admitted with GI bleed, with history of previous gastric ulcers. She was maintained on a Protonix drip with clear liquids. Bleeding scan done was unremarkable. She was seen in consultation by GI. The patient became more anemic. As a result, the patient underwent endoscopy on 10/02/2013. There is no obvious ulceration noted. She did have some minimal gastritis. Her bleeding was presumably diverticular. During the hospitalization, the patient's hemoglobin stabilized with no evidence of further bleeding. By 10/03/2012, the patient was stable and tolerating the advance in her diet. She was ready for discharge.   DISCHARGE DIAGNOSES: 1. Lower  gastrointestinal bleed, presumably diverticular.  2. Posthemorrhagic anemia.  3. Osteoarthritis.  4. History of gastric ulcers.  5. History of gastritis.  6. Osteoarthritis.  7. Benign hypertension.  8. Migraine headaches.   DISCHARGE MEDICATIONS: 1. Aspirin 81 mg p.o. daily.  2. Neurontin 300 mg p.o. twice a day. 3. Multivitamin 1 p.o. daily.  4. Vitamin D3 1000 units p.o. daily.  5. Sinemet 25/100 mg 2 p.o. twice a day.  6. Flomax 0.4 mg p.o. twice a day. 7. Lorazepam 2 mg p.o. at bedtime.  8. Klor-Con 20 mEq p.o. daily.  9. Protonix 40 mg p.o. daily.  10. Celebrex 100 mg p.o. twice a day.  FOLLOW-UP PLANS AND APPOINTMENTS: The patient was discharged on a low residue diet. She will follow up with me in the office in 1 to 2 weeks, sooner if needed.  ____________________________ Duane LopeJeffrey D. Judithann SheenSparks, MD jds:sb D: 10/09/2012 09:27:29 ET    T: 10/09/2012 11:26:18 ET        JOB#: [id number] cc: Duane LopeJeffrey D. Judithann SheenSparks, MD, <Dictator> Cyriah Childrey Rodena Medin Synethia Endicott MD ELECTRONICALLY SIGNED 10/09/2012 21:43

## 2015-02-04 NOTE — Consult Note (Signed)
Full consult to follow. [place] [facility]developed left wrist pain and swelling. Took ibuprofen x 2. Thurs night, felt dizzy and lightheaded and then followed by 2 bouts of gross rectal bleeding. No abd pain but some heartburn. Takes PPI prn. Came back home yest. Had one episode of gross melena yest afternoon. Came to ER last night for evaluation. Feels ok now. Hgb 12 initially but now 10. Had diffuse left sided diverticulosis in 2009. Had esophagitis and 4 gastric ulcers in 2011. Had gastritis in 2012. Bleeding scan just done, which was neg. Contiue clear liquids rest of today. Bleeding possibly from UGI tract due to chronic bASA use and ibuprofen use recently. Diverticular bleed also possible. Continue daily PPI. Continue to moniter hgb. Tentatively plan EGD Monday. If EGD neg but bleeding does not stop, then consider colonoscopy afterwards. Will follow. Thanks.  Electronic Signatures: Lutricia Feilh, Kreston Ahrendt (MD)  (Signed on 14-Dec-13 10:43)  Authored  Last Updated: 14-Dec-13 10:43 by Lutricia Feilh, Zehra Rucci (MD)

## 2015-02-04 NOTE — Consult Note (Signed)
No further bleeding. Hgb stable now. EGD showed mild reflux esophagitis and a small hiatal hernia. Bleeding did not occur from UGI tract. Possible diverticular bleeding, which has since stopped. Continue daily PPI. Pt can be discharged to home soon if no further bleeding. Have patient f/u with us after discharge. May need to repeat colonoscopy as outpt. Will sign off. Thanks.  Electronic Signatures: Lutricia Feilh, Crissie Aloi (MD)  (Signed on 16-Dec-13 09:49)  Authored  Last Updated: 16-Dec-13 09:49 by Lutricia Feilh, Jerred Zaremba (MD)

## 2015-02-08 NOTE — Op Note (Signed)
PATIENT NAME:  Karen Manning, Karen Manning MR#:  Manning DATE OF BIRTH:  [birth year]  DATE OF PROCEDURE:  12/18/2013  PREOPERATIVE DIAGNOSES: 1.  Symptomatic pelvic relaxation.  2.  Urinary incontinence.   POSTOPERATIVE DIAGNOSES:  1.  Symptomatic pelvic relaxation.  2.  Urinary incontinence.   OPERATIVE PROCEDURE: 1.  Anterior colporrhaphy with enterocele ligation.  2.  Pubovaginal sling with cystoscopy.  SURGEON: Sharon SellerMartin DeFrancesco, MD   CO-SURGEON: Assunta GamblesBrian Cope MD   FIRST ASSISTANT: Herby AbrahamAlan Benda, PA-S  ANESTHESIA: Spinal.   INDICATIONS: The patient is an 81 year old white female status post hysterectomy in the past, with symptomatic pelvic relaxation, who presents for definitive surgical management. Pessary trial was unsuccessful.   FINDINGS AT SURGERY: Revealed a third-degree cystocele, large enterocele, and grossly normal vaginal mucosa. There was minimal rectocele, which was not repaired.   DESCRIPTION OF PROCEDURE: The patient was brought to the operating room where she was placed in the sitting position. Spinal anesthetic was introduced without difficulty. She was placed in the dorsal lithotomy position using the candy-cane stirrups. A Betadine perineal and intravaginal prep and drape was performed in standard fashion. A Foley catheter was placed and was draining clear yellow urine from the bladder. The procedure was then performed in standard fashion. The angles of the vaginal apex were grasped with Allis clamps. A transverse incision in the vaginal mucosa was made. Metzenbaum scissors were used to undermine the vaginal mucosa within the midline. This tissue was then incised. Allis-Adair retractors were then used to provide exposure. The midline incision was carried out to within 2 cm of the urethral meatus. The pubovesical cervical fascia was then dissected from the vagina through sharp and blunt dissection. After adequate mobilization, the sling was placed in standard fashion (please see Dr.  Wynn Maudlinope's note for details). The anterior colporrhaphy was then performed using 2-0 Vicryl sutures in a horizontal mattress technique. Following reduction of the cystocele, the vaginal mucosa was trimmed and then reapproximated in the midline using 2-0 chromic sutures in a simple interrupted manner. Following reduction of the cystocele and repair of the enterocele, the procedure was then terminated.  Addendum note: The enterocele ligation was performed during the dissection for the anterior colporrhaphy. The peritoneum was entered and exploration was made noting no significant intra-abdominal adhesions. Pursestring suture x2 was placed through the peritoneal tissue in order to reduce the hernia defect. Two pursestring sutures were placed with nice reduction of the hernia.   At the end of the case, the cystoscopy was performed by Dr. Achilles Dunkope. Please see his note for details. Vagina was then packed with Kerlix gauze with Premarin cream. The patient was then awakened, mobilized, and taken to the recovery room in satisfactory condition. Estimated blood loss was 150 mL. IV fluids were 1300 mL. Urine output was not quantified. All instruments, needle, and sponge counts were verified as correct.  ____________________________ Karen DockerMartin A. DeFrancesco, MD mad:sb D: 12/18/2013 11:04:54 ET T: 12/18/2013 17:20:48 ET JOB#: [id number]  cc: Daphine DeutscherMartin A. DeFrancesco, MD, <Dictator> Karen DockerMARTIN A DEFRANCESCO MD ELECTRONICALLY SIGNED 12/29/2013 11:46

## 2015-02-08 NOTE — Op Note (Signed)
PATIENT NAME:  Karen Manning, Karen B MR#:  [id number] DATE OF BIRTH:  1930/01/05  DATE OF PROCEDURE:  12/18/2013  PRINCIPAL DIAGNOSIS: Stress urinary incontinence.   POSTOPERATIVE DIAGNOSIS: Stress urinary incontinence.   PROCEDURE: Pubovaginal sling.   SURGEON: Assunta GamblesBrian Michel Hendon, MD, and Prentice DockerMartin A. DeFrancesco, MD  ANESTHESIA: General endotracheal anesthesia.   INDICATIONS: The patient is an 81 year old female with vaginal prolapse post hysterectomy. She also has stress-component urinary incontinence. She has been tried on conservative measures for treatment for her incontinence and her prolapse. She presents for anterior-posterior  combined colporrhaphy and pubovaginal sling.   PROCEDURE: After informed consent was obtained, the patient was taken to the Operating Room and placed in the dorsal lithotomy position under general endotracheal anesthesia. The patient was then prepped and draped in the usual standard fashion. A 16-French Foley catheter was placed to gravity drainage. The initial dissection was undertaken by Dr. Greggory KeeneFrancesco. This will be dictated under a separate operative note.   Once the cystocele and enterocele was reduced, the tissue planes were developed to the symphysis and pelvic sidewall. Once this was performed, 2 small skin incisions were made at the outer aspect of the symphysis pubis. The Supris needle was inserted through the skin incision site and through the fascia, first on the left. The needle was angled posterior to the symphysis. Utilizing palpation the needle was brought through the endopelvic fascia at the edge of the symphysis pubis. It was brought through the vaginal incision sites. The right needle was similarly placed with no issues noted during passage. The segment of mesh was first inserted in the left arm. It was withdrawn through the suprapubic site. Care was taken to maintain proper orientation. The right arm was similarly advanced through the needle eye and brought  through to the suprapubic surface. Once again, care was taken to maintain proper orientation of the material. The mid urethra was identified. The Foley catheter balloon could be easily identified with the mid urethra marked. The mesh was placed at the mid urethral region, it was secured at the 6 and 12 o'clock positions utilizing a 4-0 Vicryl suture. The slack was then taken out of the mesh. The remainder of the anterior colporrhaphy and enterocele repair was performed by Dr. Greggory KeeneFrancesco and my assistance.   At the completion of the procedure, cystoscopy was performed demonstrating no urethral abnormalities. The bladder demonstrated no evidence of mesh or needle passage. Efflux was noted from both ureteral orifices. The Foley catheter was replaced. The remainder of the vaginal closure was performed. The initial plan was to perform a posterior repair, however, this did not appear to be necessary once the bulk of the enterocele had been reduced. After closure of the vaginal wall, the Foley catheter was once again removed. The bladder was filled with approximately 350 mL of water. Some urine was already present in the bladder for a total of more than 400 mL in the bladder. The mesh was pulled tight against the scope. It was held at an approximately 20 degree angle. The scope was then removed. A small amount of leakage was identified. Gentle pressure on the left arm of the mesh completely stopped any leakage. Pressure on the suprapubic region demonstrated no leak. The scope was readvanced into the urinary bladder without evidence of obstruction or tightness at the mid urethra or bladder neck region. Gentle pressure was placed on the scope. As the scope was removed, some leakage was once again encountered. Gentle pressure on the left arm once again resolved  any leakage. The scope was advanced once again with no kinking or abnormalities noted within the urethra.   The Foley catheter was replaced to gravity drainage. A  vaginal packing was then applied. The edges of the suprapubic mesh were cut level with the skin. Dermabond was placed over the sites. The patient was then returned to the supine position. She was awakened from general endotracheal anesthesia. She was taken to the recovery room in stable condition. There were no problems or complications. The patient tolerated the procedure well. Estimated blood loss for the pubovaginal sling was minimal.   ____________________________ Madolyn Frieze. Achilles Dunk, MD bsc:cs D: 12/18/2013 11:21:17 ET T: 12/18/2013 18:37:54 ET JOB#: [id number]  cc: Madolyn Frieze. Achilles Dunk, MD, <Dictator> Prentice Docker. DeFrancesco, MD Madolyn Frieze Orry Sigl MD ELECTRONICALLY SIGNED 12/23/2013 14:26

## 2015-03-04 ENCOUNTER — Other Ambulatory Visit: Payer: Self-pay | Admitting: Internal Medicine

## 2015-03-04 DIAGNOSIS — Z1231 Encounter for screening mammogram for malignant neoplasm of breast: Secondary | ICD-10-CM

## 2015-04-07 ENCOUNTER — Ambulatory Visit: Payer: Self-pay

## 2015-04-10 DIAGNOSIS — Z87898 Personal history of other specified conditions: Secondary | ICD-10-CM | POA: Insufficient documentation

## 2015-04-10 DIAGNOSIS — G43909 Migraine, unspecified, not intractable, without status migrainosus: Secondary | ICD-10-CM | POA: Insufficient documentation

## 2015-04-10 DIAGNOSIS — I10 Essential (primary) hypertension: Secondary | ICD-10-CM | POA: Insufficient documentation

## 2015-04-10 DIAGNOSIS — K259 Gastric ulcer, unspecified as acute or chronic, without hemorrhage or perforation: Secondary | ICD-10-CM | POA: Insufficient documentation

## 2015-04-10 DIAGNOSIS — M1990 Unspecified osteoarthritis, unspecified site: Secondary | ICD-10-CM | POA: Insufficient documentation

## 2015-04-10 DIAGNOSIS — M810 Age-related osteoporosis without current pathological fracture: Secondary | ICD-10-CM | POA: Insufficient documentation

## 2015-04-10 DIAGNOSIS — E785 Hyperlipidemia, unspecified: Secondary | ICD-10-CM | POA: Insufficient documentation

## 2015-04-10 DIAGNOSIS — D509 Iron deficiency anemia, unspecified: Secondary | ICD-10-CM | POA: Insufficient documentation

## 2015-04-10 DIAGNOSIS — Z9109 Other allergy status, other than to drugs and biological substances: Secondary | ICD-10-CM | POA: Insufficient documentation

## 2015-04-10 DIAGNOSIS — Z87442 Personal history of urinary calculi: Secondary | ICD-10-CM | POA: Insufficient documentation

## 2015-04-10 HISTORY — DX: Gastric ulcer, unspecified as acute or chronic, without hemorrhage or perforation: K25.9

## 2015-04-10 HISTORY — DX: Essential (primary) hypertension: I10

## 2015-04-10 HISTORY — DX: Migraine, unspecified, not intractable, without status migrainosus: G43.909

## 2015-04-14 ENCOUNTER — Ambulatory Visit: Payer: Self-pay

## 2015-04-24 DIAGNOSIS — R200 Anesthesia of skin: Secondary | ICD-10-CM | POA: Insufficient documentation

## 2015-04-24 DIAGNOSIS — R202 Paresthesia of skin: Secondary | ICD-10-CM | POA: Insufficient documentation

## 2015-04-29 ENCOUNTER — Ambulatory Visit (INDEPENDENT_AMBULATORY_CARE_PROVIDER_SITE_OTHER): Payer: Medicare Other | Admitting: Urology

## 2015-04-29 VITALS — BP 137/79 | HR 92 | Ht 64.0 in | Wt 152.8 lb

## 2015-04-29 DIAGNOSIS — N390 Urinary tract infection, site not specified: Secondary | ICD-10-CM | POA: Diagnosis not present

## 2015-04-29 DIAGNOSIS — IMO0002 Reserved for concepts with insufficient information to code with codable children: Secondary | ICD-10-CM

## 2015-04-29 DIAGNOSIS — N8110 Cystocele, unspecified: Secondary | ICD-10-CM

## 2015-04-29 LAB — MICROSCOPIC EXAMINATION

## 2015-04-29 LAB — URINALYSIS, COMPLETE
BILIRUBIN UA: NEGATIVE
Glucose, UA: NEGATIVE
Ketones, UA: NEGATIVE
Nitrite, UA: NEGATIVE
PH UA: 6.5 (ref 5.0–7.5)
PROTEIN UA: NEGATIVE
RBC UA: NEGATIVE
Specific Gravity, UA: 1.01 (ref 1.005–1.030)
UUROB: 0.2 mg/dL (ref 0.2–1.0)

## 2015-04-29 LAB — BLADDER SCAN AMB NON-IMAGING: Scan Result: 68

## 2015-04-29 MED ORDER — TAMSULOSIN HCL 0.4 MG PO CAPS: 0.4000 mg | ORAL_CAPSULE | Freq: Every day | ORAL | Status: DC

## 2015-04-29 NOTE — Addendum Note (Signed)
Addended by: Mervin KungWILLIAMS, Sira Adsit K on: 04/29/2015 02:52 PM   Modules accepted: Orders

## 2015-04-29 NOTE — Progress Notes (Signed)
I have been asked to see the patient by Dr. Aram Beecham, MD, for evaluation and management of recurrent UTIs.  History of present illness: 61F with a history of Parkinson's Disease and a cystocele presents today for further eval and management of recurrent UTIs. The patient is here to establish care more locally, she was a former patient of Dr. Assunta Gambles. She was last seen sometime last year in follow-up for a mid urethral sling that was performed in combination with a cystocele repair by Dr. Gar Ponto. The patient's procedure was performed in 2014. Since that time, the patient does state that she feels as if her bladder has dropped again. She denies any urinary incontinence. She states that she has a strong stream, she does not have to strain to void, and feels that she empties her bladder completely. She denies any progressive urinary tract symptoms including frequency, urgency, or dysuria. The patient states that she's been treated 3 times for urinary tract infections over the last 4 or 5 months. These have been based on urine analyses and/or cultures. She was treated with ciprofloxacin each time. At the time of the diagnosis, the patient asymptomatic. She does not have a history of recurrent urinary tract infections prior. 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 She was initiated on Flomax by Dr. Cindee Lame sometime ago.  Review of systems: A 12 point comprehensive review of systems was obtained and is negative unless otherwise stated in the history of present illness.  Patient Active Problem List   Diagnosis Date Noted  . Allergy to environmental factors 04/10/2015  . Gastric ulcer 04/10/2015  . H/O angioedema 04/10/2015  . H/O renal calculi 04/10/2015  . HLD (hyperlipidemia) 04/10/2015  . BP (high blood pressure) 04/10/2015  . Anemia, iron deficiency 04/10/2015  . Headache, migraine 04/10/2015  . Arthritis, degenerative 04/10/2015  . Osteoporosis, post-menopausal 04/10/2015  . Bloodgood  disease 05/02/2014  . Essential (primary) hypertension 05/02/2014  . Arthritis of knee, degenerative 05/02/2014  . Allergic state 05/02/2014  . H/O disease 05/02/2014  . H/O urinary stone 05/02/2014  . 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 Abnormal blood sugar 02/28/2014  . Female genuine stress incontinence 01/18/2014  . Infection of urinary tract 12/25/2013  . Parkinson disease 06/26/2013  . Cystocele, midline 07/03/2012  . Incomplete bladder emptying 07/03/2012  . Mixed incontinence 07/03/2012  . Pelvic relaxation due to vaginal vault prolapse, posthysterectomy 07/03/2012    Current Outpatient Prescriptions on File Prior to Visit  Medication Sig Dispense Refill  . carbidopa-levodopa (SINEMET IR) 25-100 MG per tablet Take 1 tablet by mouth 3 (three) times daily. 90 tablet 11  . cholecalciferol (VITAMIN D) 1000 UNITS tablet Take 1,000 Units by mouth daily.    . ferrous fumarate (HEMOCYTE - 106 MG FE) 325 (106 FE) MG TABS tablet Take 1 tablet by mouth.    . ferrous sulfate 325 (65 FE) MG tablet Take 325 mg by mouth daily with breakfast.    . gabapentin (NEURONTIN) 300 MG capsule     . LORazepam (ATIVAN) 2 MG tablet     . pantoprazole (PROTONIX) 40 MG tablet     . polyethylene glycol powder (GLYCOLAX/MIRALAX) powder     . potassium chloride SA (K-DUR,KLOR-CON) 20 MEQ tablet     . tamsulosin (FLOMAX) 0.4 MG CAPS capsule     . Vitamin D, Ergocalciferol, (DRISDOL) 50000 UNITS CAPS capsule Take by mouth.     No current facility-administered medications on file prior to visit.    Past Medical  History  Diagnosis Date  . High blood pressure   . Hypercalcemia   . Kidney stone     Past Surgical History  Procedure Laterality Date  . Cholecystectomy    . Partial hysterectomy    . Bladder surgery      History  Substance Use Topics  . Smoking status: Never Smoker   . Smokeless tobacco: Never Used  . Alcohol Use: Yes     Comment: Seldom    History reviewed. No pertinent  family history.  PE: There were no vitals filed for this visit. Patient appears to be in no acute distress  patient is alert and oriented x3 Atraumatic normocephalic head No cervical or supraclavicular lymphadenopathy appreciated No increased work of breathing, no audible wheezes/rhonchi Regular sinus rhythm/rate Abdomen is soft, nontender, nondistended, no CVA or suprapubic tenderness The patient's vaginal exam demonstrates a prolapsed bladder down to the vaginal introitus. The urethra is fixed and nonmobile. There is note demonstrable stress incontinence. There is some prolapse of the posterior compartment to the level of the hymen. The vaginal mucosa is healthy appearing. Lower extremities are symmetric without appreciable edema Grossly neurologically intact No identifiable skin lesions  The patient's postvoid residual in the supine position using ultrasound: 68 mL's The patient's urine analysis today in clinic demonstrates greater than 30 white blood cells per high-powered field, no evidence of microscopic hematuria, many bacteria, negative nitrites and leukocyte esterase.  No results for input(s): WBC, HGB, HCT in the last 72 hours. No results for input(s): NA, K, CL, CO2, GLUCOSE, BUN, CREATININE, CALCIUM in the last 72 hours. No results for input(s): LABPT, INR in the last 72 hours. No results for input(s): LABURIN in the last 72 hours. No results found for this or any previous visit.  Imaging: none  Imp: The patient has what appears to be asymptomatic bacteriuria. She also has recurrence of her cystocele. She does however appear to be emptying her bladder well. She denies any incontinence. She is not straining to void or having any symptomatic lower urinary tract symptoms resulting from her cystocele.  Recommendations: I explained to the patient that we don't treat asymptomatic bacteria. I explained the rationale behind it. I recommended that if she does not have symptoms of  urinary tract infection including dysuria, urgency, frequency, pain, fever, chills, or worsening incontinence that we not put her on antibody is. Further, I recommended that she consider taking a probiotic, lactobacillus was my suggestion, that would help restore the normal vaginal flora. She should take this on a daily basis. Further, I recommended that she consider taking cranberry tablets twice daily to prevent invasive bacterial colonization along the urinary tract.  I also recommended that she continue with the Flomax giving her neurological comorbidity. As it relates to the patient's cystocele, this point she is asymptomatic. If she becomes symptomatic she may need a repair down the road. We will plan to follow-up with the patient in 6 months to reevaluate her symptoms and ensure that she is emptying her bladder completely.  Cc: Dr. Daphine DeutscherMartin DeFrancisco, MD  Crist FatHERRICK, Artemis Loyal W

## 2015-05-01 LAB — CULTURE, URINE COMPREHENSIVE

## 2015-06-04 ENCOUNTER — Telehealth: Payer: Self-pay | Admitting: Obstetrics and Gynecology

## 2015-06-04 NOTE — Telephone Encounter (Signed)
Spoke with patient concerning +UC  She remains asymptomatic. WIll not treat per Dr. Jasmine Awe recommendations. Patient knows to call if she experiences any changes in urinary symptoms.  F/u scheduled in January.

## 2015-06-04 NOTE — Telephone Encounter (Signed)
Urine culture positive form last visit.   Per Dr. Marlou Porch patient was asymptomatic at last visit.  Most likely asymptomatic bacteremia that will not require antibiotics unless patient has developed irritative voiding symptoms.  Left message for patient to return call to assess for symptoms.

## 2015-07-01 ENCOUNTER — Ambulatory Visit: Payer: Medicare Other | Admitting: Neurology

## 2015-07-03 ENCOUNTER — Other Ambulatory Visit: Payer: Self-pay | Admitting: Neurology

## 2015-07-03 NOTE — Telephone Encounter (Signed)
Appt was cancelled noting: Patient (tx to a local dr. will not be coming back to this office. )

## 2015-07-04 NOTE — Telephone Encounter (Signed)
Last appt cancelled noting: Patient (tx to a local dr. will not be coming back to this office. )

## 2015-07-08 ENCOUNTER — Ambulatory Visit
Admission: RE | Admit: 2015-07-08 | Discharge: 2015-07-08 | Disposition: A | Discharge status: Home / Self Care | Payer: Medicare Other | Source: Ambulatory Visit | Attending: Internal Medicine | Admitting: Internal Medicine

## 2015-07-08 ENCOUNTER — Other Ambulatory Visit: Payer: Self-pay | Admitting: Internal Medicine

## 2015-07-08 DIAGNOSIS — Z1231 Encounter for screening mammogram for malignant neoplasm of breast: Secondary | ICD-10-CM

## 2015-08-12 ENCOUNTER — Ambulatory Visit (INDEPENDENT_AMBULATORY_CARE_PROVIDER_SITE_OTHER): Payer: Medicare Other | Admitting: Obstetrics and Gynecology

## 2015-08-12 ENCOUNTER — Encounter: Payer: Self-pay | Admitting: Obstetrics and Gynecology

## 2015-08-12 VITALS — BP 159/73 | HR 92 | Ht 64.0 in | Wt 153.0 lb

## 2015-08-12 DIAGNOSIS — N816 Rectocele: Secondary | ICD-10-CM

## 2015-08-12 DIAGNOSIS — K469 Unspecified abdominal hernia without obstruction or gangrene: Secondary | ICD-10-CM

## 2015-08-12 DIAGNOSIS — N8110 Cystocele, unspecified: Secondary | ICD-10-CM

## 2015-08-12 DIAGNOSIS — IMO0002 Reserved for concepts with insufficient information to code with codable children: Secondary | ICD-10-CM

## 2015-08-12 NOTE — Patient Instructions (Signed)
1.  Return in 6 months for follow-up. 2.  Timed voiding to Eliminate leaking

## 2015-08-12 NOTE — Progress Notes (Signed)
Patient ID: Karen Manning, female   DOB: 01/15/1930, 81 y.o.   MRN: [id number]   Chief complaint: 1. Pelvic Organ prolapse.   6 month f/u: ant repair With enterocele ligation and Pubovaginal sling.  did not see dr cope- have new urologist  bladder - ok   leaking urine sometime wearing pad   Patient denies pelvic pressure, pelvic pain, vaginal discharge.  She does report regular bowel function approximately every other day.  She does not have to strain significantly for bowel movements.  Past medical history, past surgical history, problem list, medications, and allergies are reviewed.  OBJECTIVE: BP 159/73 mmHg  Pulse 92  Ht 5\' 4"  (1.626 m)  Wt 153 lb (69.4 kg)  BMI 26.25 kg/m2 Pleasant elderly female in no acute distress.  She is alert and oriented. Back: No CVA tenderness. Abdomen: Soft, nontender, without organomegaly Pelvic: External genitalia-normal BUS-normal Vagina-moderate atrophy; there appears to be reasonable support at the urethrovesical junction; there is recurrent prolapse with first to second-degree cystocele, prominent enterocele (second-degree) and mild rectocele  IMPRESSION: 1.  Recurrent pelvic organ prolapse with minimal symptoms. 2.  Patient not a good surgical candidate for repeat surgery at this time. 3.  Grade 2 enterocele, minimally symptomatic. 4.  Mild rectocele, minimally symptomatic. 5.  Grade 2 cystocele, minimally symptomatic.  PLAN: 1.  Monitor symptomatology. 2.  Timed voiding to eliminate leaking episodes. 3.  Return in 6 months for follow-up.  A total of 15 minutes were spent face-to-face with the patient during this encounter and over half of that time dealt with counseling and coordination of care.  Herold HarmsMartin A Defrancesco, MD  Note: This dictation was prepared with Dragon dictation along with smaller phrase technology. Any transcriptional errors that result from this process are unintentional.

## 2015-10-28 ENCOUNTER — Ambulatory Visit: Payer: Medicare Other | Admitting: Urology

## 2015-12-01 ENCOUNTER — Ambulatory Visit (INDEPENDENT_AMBULATORY_CARE_PROVIDER_SITE_OTHER): Payer: Medicare Other | Admitting: Urology

## 2015-12-01 ENCOUNTER — Encounter: Payer: Self-pay | Admitting: Urology

## 2015-12-01 VITALS — BP 166/97 | HR 92 | Ht 64.0 in | Wt 151.2 lb

## 2015-12-01 DIAGNOSIS — N390 Urinary tract infection, site not specified: Secondary | ICD-10-CM | POA: Diagnosis not present

## 2015-12-01 MED ORDER — TAMSULOSIN HCL 0.4 MG PO CAPS: 0.4000 mg | ORAL_CAPSULE | Freq: Every day | ORAL | Status: DC

## 2015-12-01 NOTE — Progress Notes (Signed)
I have been asked to see the patient by Dr. Aram Beecham, MD, for evaluation and management of recurrent UTIs.  History of present illness: 60F with a history of Parkinson's Disease and a cystocele presents today for further eval and management of recurrent UTIs. The patient is here to establish care more locally, she was a former patient of Dr. Assunta Gambles. She was last seen sometime last year in follow-up for a mid urethral sling that was performed in combination with a cystocele repair by Dr. Gar Ponto. The patient's procedure was performed in 2014. Since that time, the patient does state that she feels as if her bladder has dropped again. She denies any urinary incontinence. She states that she has a strong stream, she does not have to strain to void, and feels that she empties her bladder completely.  Interval: The patient is seen in 6 month follow-up for the above. At her last visit, she was noted to have a prolapsed bladder down to the vaginal introitus but was emptying her bladder fairly well. She was continued on Flomax for weak stream. She was also started on cranberry tablets as well as lactobacillus probiotic for preventative measures. Since she was last seen she has not had any significant progression of her voiding symptoms. She is not having any dysuria, worsening incontinence, hematuria, or stigmata of UTI. She has not taken Flomax for the past 5 days, she has not noted any significant difference at this time.   The patient denies any progression of her Parkinson's disease, no changes to her bowel movements, and no significant constitutional symptoms.  Review of systems: A 12 point comprehensive review of systems was obtained and is negative unless otherwise stated in the history of present illness.  Patient Active Problem List   Diagnosis Date Noted  . Numbness and tingling 04/24/2015  . Allergy to environmental factors 04/10/2015  . Gastric ulcer 04/10/2015  . H/O angioedema  04/10/2015  . H/O renal calculi 04/10/2015  . HLD (hyperlipidemia) 04/10/2015  . BP (high blood pressure) 04/10/2015  . Anemia, iron deficiency 04/10/2015  . Headache, migraine 04/10/2015  . Arthritis, degenerative 04/10/2015  . Osteoporosis, post-menopausal 04/10/2015  . Bloodgood disease 05/02/2014  . Essential (primary) hypertension 05/02/2014  . Arthritis of knee, degenerative 05/02/2014  . Allergic state 05/02/2014  . H/O disease 05/02/2014  . H/O urinary stone 05/02/2014  . 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 Abnormal blood sugar 02/28/2014  . Abnormal glucose level 02/28/2014  . Female genuine stress incontinence 01/18/2014  . Infection of urinary tract 12/25/2013  . Parkinson disease (HCC) 06/26/2013  . Cystocele, midline 07/03/2012  . Incomplete bladder emptying 07/03/2012  . Mixed incontinence 07/03/2012  . Pelvic relaxation due to vaginal vault prolapse, posthysterectomy 07/03/2012  . Prolapse of vaginal vault after hysterectomy 07/03/2012    Current Outpatient Prescriptions on File Prior to Visit  Medication Sig Dispense Refill  . cholecalciferol (VITAMIN D) 1000 UNITS tablet Take 1,000 Units by mouth daily.    . ferrous sulfate 325 (65 FE) MG tablet Take 325 mg by mouth daily with breakfast.    . gabapentin (NEURONTIN) 300 MG capsule     . LORazepam (ATIVAN) 2 MG tablet     . pantoprazole (PROTONIX) 40 MG tablet     . potassium chloride SA (K-DUR,KLOR-CON) 20 MEQ tablet     . polyethylene glycol powder (GLYCOLAX/MIRALAX) powder Reported on 12/01/2015    . rOPINIRole (REQUIP) 0.5 MG tablet Take by mouth. Reported on 12/01/2015  No current facility-administered medications on file prior to visit.    Past Medical History  Diagnosis Date  . High blood pressure   . Hypercalcemia   . Kidney stone   . Recurrent UTI   . Arthritis     hands  . Female bladder prolapse   . History of kidney stones   . Constipation   . BP (high blood pressure) 04/10/2015  .  Essential (primary) hypertension 05/02/2014  . Headache, migraine 04/10/2015  . Gastric ulcer 04/10/2015  . Parkinson disease (HCC) 06/26/2013  . Arthritis of knee, degenerative 05/02/2014    Past Surgical History  Procedure Laterality Date  . Cholecystectomy    . Partial hysterectomy    . Bladder surgery      sling  . Kidney stone surgery    . Breast biopsy Left 1986    benign    Social History  Substance Use Topics  . Smoking status: Never Smoker   . Smokeless tobacco: Never Used  . Alcohol Use: No    Family History  Problem Relation Age of Onset  . Gait disorder Sister     Stiff Persons Syndrome  . Diabetes Sister   . Gallbladder disease Sister   . Breast cancer Sister   . Thrombosis Sister   . Kidney disease Neg Hx   . Prostate cancer Neg Hx   . Bladder Cancer Neg Hx   . Diabetes Paternal Grandfather     PE: Filed Vitals:   12/01/15 1049  BP: 166/97  Pulse: 92  Height:  (1.626 m)  Weight: 151 lb 3.2 oz (68.584 kg)   Patient appears to be in no acute distress  patient is alert and oriented x3   the patient's urinalysis today appears to be contaminant with numerous squamous epithelial cells. We did not send a urine culture given that she is asymptomatic.   No results for input(s): WBC, HGB, HCT in the last 72 hours. No results for input(s): NA, K, CL, CO2, GLUCOSE, BUN, CREATININE, CALCIUM in the last 72 hours. No results for input(s): LABPT, INR in the last 72 hours. No results for input(s): LABURIN in the last 72 hours.   Imaging: none  Imp: The patient has what appears to be asymptomatic bacteriuria. She also has recurrence of her cystocele. She does however appear to be emptying her bladder well  Recommendations: Over the past 6 months, the patient has done well. She has had no recurrent urinary tract infections or progression of her voiding symptoms. She has not noted any significant change since stopping the Flomax. I suggested that she stay off  Flomax for another several weeks prior to restarting it to make sure that it was truly helping her. If she is not noting any significant benefit from the Flomax, she should stop this. She will continue with cranberry tablets as well as a probiotic. Given her success over the past 6 months, we'll plan to follow up with her in one year. If the patient develops symptoms in the interim, she is encouraged to follow-up sooner.   Cc: Dr. Daphine Deutscher DeFrancisco, MD  [facility]

## 2015-12-02 LAB — MICROSCOPIC EXAMINATION

## 2015-12-02 LAB — URINALYSIS, COMPLETE
BILIRUBIN UA: NEGATIVE
Glucose, UA: NEGATIVE
Ketones, UA: NEGATIVE
Nitrite, UA: NEGATIVE
PH UA: 6 (ref 5.0–7.5)
Protein, UA: NEGATIVE
RBC UA: NEGATIVE
Specific Gravity, UA: <1.005 — ABNORMAL LOW (ref 1.005–1.030)
UUROB: 0.2 mg/dL (ref 0.2–1.0)

## 2016-02-10 ENCOUNTER — Encounter: Payer: Self-pay | Admitting: Obstetrics and Gynecology

## 2016-02-10 ENCOUNTER — Ambulatory Visit (INDEPENDENT_AMBULATORY_CARE_PROVIDER_SITE_OTHER): Payer: Medicare Other | Admitting: Obstetrics and Gynecology

## 2016-02-10 VITALS — BP 163/99 | HR 98 | Ht 64.0 in | Wt 151.5 lb

## 2016-02-10 DIAGNOSIS — K469 Unspecified abdominal hernia without obstruction or gangrene: Secondary | ICD-10-CM

## 2016-02-10 DIAGNOSIS — N816 Rectocele: Secondary | ICD-10-CM | POA: Diagnosis not present

## 2016-02-10 DIAGNOSIS — IMO0002 Reserved for concepts with insufficient information to code with codable children: Secondary | ICD-10-CM

## 2016-02-10 DIAGNOSIS — N8110 Cystocele, unspecified: Secondary | ICD-10-CM | POA: Diagnosis not present

## 2016-02-10 NOTE — Patient Instructions (Signed)
1. Continue with timed voiding 2. Continue using stool softeners and fiber diet 3. Return in 6 months for follow-up

## 2016-02-10 NOTE — Progress Notes (Signed)
GYN ENCOUNTER NOTE  Subjective:       Karen Manning is a 82 y.o. No obstetric history on file. female is here for gynecologic evaluation of the following issues:  1. Pelvic Organ Prolapse -6 month f/u:  12/18/2013 Ant repair With enterocele ligation and Pubovaginal sling  12/01/2015 - Followed up with Urology  Patient reports bladder "feels like it has dropped a little."  She admits to wearing a pad for minimal bladder leakage, and occasional consitipation without significant straining.  Patient denies pain, pelvic pressure or vaginal discharge.    Gynecologic History No LMP recorded. Patient has had a hysterectomy. Contraception: post menopausal status  Last mammogram: 07/08/2015. Results were: normal  Obstetric History OB History  No data available    Past Medical History  Diagnosis Date  . High blood pressure   . Hypercalcemia   . Kidney stone   . Recurrent UTI   . Arthritis     hands  . Female bladder prolapse   . History of kidney stones   . Constipation   . BP (high blood pressure) 04/10/2015  . Essential (primary) hypertension 05/02/2014  . Headache, migraine 04/10/2015  . Gastric ulcer 04/10/2015  . Parkinson disease (HCC) 06/26/2013  . Arthritis of knee, degenerative 05/02/2014    Past Surgical History  Procedure Laterality Date  . Cholecystectomy    . Partial hysterectomy    . Bladder surgery      sling  . Kidney stone surgery    . Breast biopsy Left 1986    benign    Current Outpatient Prescriptions on File Prior to Visit  Medication Sig Dispense Refill  . cholecalciferol (VITAMIN D) 1000 UNITS tablet Take 1,000 Units by mouth daily.    . Cranberry 125 MG TABS Take 1 tablet by mouth.    . ferrous sulfate 325 (65 FE) MG tablet Take 325 mg by mouth daily with breakfast.    . gabapentin (NEURONTIN) 300 MG capsule     . LORazepam (ATIVAN) 2 MG tablet     . pantoprazole (PROTONIX) 40 MG tablet     . polyethylene glycol powder (GLYCOLAX/MIRALAX) powder  Reported on 12/01/2015    . potassium chloride SA (K-DUR,KLOR-CON) 20 MEQ tablet      No current facility-administered medications on file prior to visit.    Allergies  Allergen Reactions  . Cephalexin Other (See Comments)    Other reaction(s): UNKNOWN  . Morphine     Other reaction(s): Unknown Other reaction(s): UNKNOWN  . Morphine Sulfate Er Beads Other (See Comments)  . Naproxen Other (See Comments)    Stomach ulcers  . Naproxen Sodium Other (See Comments)    Other reaction(s): Other (See Comments) Stomach ulcers Stomach ulcers  . Penicillins     Other reaction(s): UNKNOWN  . Sulfamethoxazole-Trimethoprim Rash    Social History   Social History  . Marital Status: Widowed    Spouse Name: N/A  . Number of Children: 2  . Years of Education: HS    Occupational History  . Not on file.   Social History Main Topics  . Smoking status: Never Smoker   . Smokeless tobacco: Never Used  . Alcohol Use: No  . Drug Use: No  . Sexual Activity: Not Currently   Other Topics Concern  . Not on file   Social History Narrative   Patient is retired and lives alone.    Patient has 2 children.     Family History  Problem Relation Age of Onset  .  Gait disorder Sister     Stiff Persons Syndrome  . Diabetes Sister   . Gallbladder disease Sister   . Breast cancer Sister   . Thrombosis Sister   . Kidney disease Neg Hx   . Prostate cancer Neg Hx   . Bladder Cancer Neg Hx   . Diabetes Paternal Grandfather     The following portions of the patient's history were reviewed and updated as appropriate: allergies, current medications, past family history, past medical history, past social history, past surgical history and problem list.  Review of Systems Review of Systems - Negative except what recorded in HPI.  Objective:   BP 163/99 mmHg  Pulse 98  Ht 5\' 4"  (1.626 m)  Wt 151 lb 8 oz (68.72 kg)  BMI 25.99 kg/m2 CONSTITUTIONAL: Well-developed, well-nourished female in no  acute distress.  HENT:  Normocephalic, atraumatic.  NECK: Normal range of motion, supple, no masses.  Normal thyroid.  SKIN: Skin is warm and dry. No rash noted. Not diaphoretic. No erythema. No pallor. NEUROLGIC: Alert and oriented to person, place, and time.  PSYCHIATRIC: Normal mood and affect. Normal behavior. Normal judgment and thought content. CARDIOVASCULAR: RRR, no murmur, rubs or gallops RESPIRATORY: CTA bilaterally PELVIC:  External Genitalia: Normal  BUS - Normal  Vagina-moderate atrophy; there appears to be reasonable support at the urethrovesical  junction; there is recurrent prolapse with first to second-degree cystocele; enterocele (second-degree) and mild rectocele  Bladder: Nontender    Assessment:   1. Recurrent pelvic organ prolapse with minimal symptoms. 2. Patient not a good surgical candidate for repeat surgery at this time. 3. Grade 2 enterocele, minimally symptomatic. 4. Mild rectocele, minimally symptomatic. 5. Grade 2 cystocele, minimally symptomatic. 6.  Vaginal atrophy- Moderate   Plan:   PLAN: 1. Monitor symptomatology. 2. Timed voiding to eliminate leaking episodes. 3.  Use stool softener/fiber supplement to keep stool soft and formed. 4. Return in 6 months for follow-up  Cyan Moultrie P. Akira Perusse, PA-S Herold HarmsMartin A Defrancesco, MD   I have seen, interviewed, and examined the patient in conjunction with the [employer] and affirm the diagnosis and management plan. Martin A. DeFrancesco, MD, FACOG   Note: This dictation was prepared with Dragon dictation along with smaller phrase technology. Any transcriptional errors that result from this process are unintentional.

## 2016-03-09 ENCOUNTER — Other Ambulatory Visit: Payer: Self-pay | Admitting: Internal Medicine

## 2016-03-09 DIAGNOSIS — Z1231 Encounter for screening mammogram for malignant neoplasm of breast: Secondary | ICD-10-CM

## 2016-07-08 ENCOUNTER — Other Ambulatory Visit: Payer: Self-pay | Admitting: Internal Medicine

## 2016-07-08 ENCOUNTER — Ambulatory Visit
Admission: RE | Admit: 2016-07-08 | Discharge: 2016-07-08 | Disposition: A | Discharge status: Home / Self Care | Payer: Medicare Other | Source: Ambulatory Visit | Attending: Internal Medicine | Admitting: Internal Medicine

## 2016-07-08 DIAGNOSIS — Z1231 Encounter for screening mammogram for malignant neoplasm of breast: Secondary | ICD-10-CM | POA: Diagnosis not present

## 2016-08-10 ENCOUNTER — Encounter: Payer: Self-pay | Admitting: Obstetrics and Gynecology

## 2016-08-10 ENCOUNTER — Ambulatory Visit (INDEPENDENT_AMBULATORY_CARE_PROVIDER_SITE_OTHER): Payer: Medicare Other | Admitting: Obstetrics and Gynecology

## 2016-08-10 VITALS — BP 175/77 | HR 97 | Ht 64.0 in | Wt 154.8 lb

## 2016-08-10 DIAGNOSIS — N8111 Cystocele, midline: Secondary | ICD-10-CM | POA: Diagnosis not present

## 2016-08-10 DIAGNOSIS — K469 Unspecified abdominal hernia without obstruction or gangrene: Secondary | ICD-10-CM

## 2016-08-10 NOTE — Patient Instructions (Signed)
1. Return in 1 year for follow-up 2. Return as needed if pelvic pressure or difficulties with voiding or bowel movements develop

## 2016-08-10 NOTE — Progress Notes (Signed)
Chief complaint: 1. Pelvic organ prolapse  Patient presents for follow-up. Status post anterior colporrhaphy with enterocele ligation and pubovaginal sling in 12/18/2013. Urology has placed her on cranberry pills to help reduce infections. He has encouraged her to void every 2 hours. Bowel function is normal. She is not experiencing any kind of pelvic pressure or AT the introitus.  Past medical history, past surgical history, problem list, medications, and allergies are reviewed.  OBJECTIVE: BP (!) 175/77   Pulse 97   Ht 5\' 4"  (1.626 m)   Wt 154 lb 12.8 oz (70.2 kg)   BMI 26.57 kg/m  Pleasant female in no acute distress Pelvic exam: External genitalia-normal BUS-normal Vagina-second degree cystocele; probable enterocele, mild rectocele; vaginal mucosa with fair estrogen effect Bimanual-palpable masses or tenderness  ASSESSMENT: 1. Second degree cystocele and enterocele, minimally symptomatic 2. Status post anterior colporrhaphy with large enterocele ligation in March 2015 3. Possible incomplete bladder emptying  PLAN: 1. Follow-up in 1 year or sooner if gynecologic symptoms worsen 2. Continue with cranberry tablets and timed voiding  A total of 15 minutes were spent face-to-face with the patient during this encounter and over half of that time dealt with counseling and coordination of care.  Herold HarmsMartin A Fawne Hughley, MD  Note: This dictation was prepared with Dragon dictation along with smaller phrase technology. Any transcriptional errors that result from this process are unintentional.

## 2016-11-30 ENCOUNTER — Ambulatory Visit (INDEPENDENT_AMBULATORY_CARE_PROVIDER_SITE_OTHER): Payer: Medicare Other | Admitting: Urology

## 2016-11-30 ENCOUNTER — Encounter: Payer: Self-pay | Admitting: Urology

## 2016-11-30 VITALS — BP 170/97 | HR 98 | Ht 64.0 in | Wt 152.3 lb

## 2016-11-30 DIAGNOSIS — N393 Stress incontinence (female) (male): Secondary | ICD-10-CM | POA: Diagnosis not present

## 2016-11-30 DIAGNOSIS — N8111 Cystocele, midline: Secondary | ICD-10-CM | POA: Diagnosis not present

## 2016-11-30 LAB — BLADDER SCAN AMB NON-IMAGING: SCAN RESULT: 102

## 2016-11-30 NOTE — Progress Notes (Signed)
11/30/2016 11:28 AM   Heywood Footman [birth year] [id number]  Referring provider: [name], MD [address] [facility] [place], [place] [address]  No chief complaint on file.   HPI: F/u recurrent UTI. Seen in 2017 with h/o Parkinson's Disease and a cystocele. H/o a mid urethral sling that was performed in combination with a cystocele repair by Dr. Gar Ponto in 12/2013. On exam, she had evidence of recurrent cystocele. She took tamsulosin for awhile for a weak stream, but had normal PVR's. She's been treated with cranberry tablets and as well as lactobacillus probiotic for preventative measures.   Today, she is well. She's off tamsulosin and continues timed voiding. She's only had one UTI in the past year. She does continue cranberry and probiotics. She feels "good". Occasional feeling of inc emptying. She has no gross hematuria or flank pain. She was taken off meds for Parkinson's and told "she didn't have it".  PVR ~ 100 ml today. She couldn't leave a specimen.   PMH: Past Medical History:  Diagnosis Date  . Arthritis    hands  . Arthritis of knee, degenerative 05/02/2014  . BP (high blood pressure) 04/10/2015  . Constipation   . Essential (primary) hypertension 05/02/2014  . Female bladder prolapse   . Gastric ulcer 04/10/2015  . Headache, migraine 04/10/2015  . High blood pressure   . History of kidney stones   . Hypercalcemia   . Kidney stone   . Parkinson disease (HCC) 06/26/2013  . Recurrent UTI     Surgical History: Past Surgical History:  Procedure Laterality Date  . BLADDER SURGERY     sling  . BREAST CYST ASPIRATION Left yrs ago  . BREAST EXCISIONAL BIOPSY Left 1986   benign  . CHOLECYSTECTOMY    . KIDNEY STONE SURGERY    . PARTIAL HYSTERECTOMY      Home Medications:  Allergies as of 11/30/2016      Reactions   Cephalexin Other (See Comments)   Other reaction(s): UNKNOWN   Morphine    Other reaction(s): Unknown Other reaction(s):  UNKNOWN   Morphine Sulfate Er Beads Other (See Comments)   Naproxen Other (See Comments)   Stomach ulcers   Naproxen Sodium Other (See Comments)   Other reaction(s): Other (See Comments) Stomach ulcers Stomach ulcers   Penicillins    Other reaction(s): UNKNOWN   Sulfamethoxazole-trimethoprim Rash      Medication List       Accurate as of 11/30/16 11:28 AM. Always use your most recent med list.          aspirin EC 81 MG tablet Take by mouth.   cholecalciferol 1000 units tablet Commonly known as:  VITAMIN D Take 1,000 Units by mouth daily.   Cranberry 125 MG Tabs Take 1 tablet by mouth.   ferrous sulfate 325 (65 FE) MG tablet Take 325 mg by mouth daily with breakfast.   gabapentin 300 MG capsule Commonly known as:  NEURONTIN   LORazepam 2 MG tablet Commonly known as:  ATIVAN   multivitamin tablet Take 1 tablet by mouth daily.   pantoprazole 40 MG tablet Commonly known as:  PROTONIX   polyethylene glycol powder powder Commonly known as:  GLYCOLAX/MIRALAX Reported on 12/01/2015   potassium chloride SA 20 MEQ tablet Commonly known as:  K-DUR,KLOR-CON       Allergies:  Allergies  Allergen Reactions  . Cephalexin Other (See Comments)    Other reaction(s): UNKNOWN  . Morphine     Other  reaction(s): Unknown Other reaction(s): UNKNOWN  . Morphine Sulfate Er Beads Other (See Comments)  . Naproxen Other (See Comments)    Stomach ulcers  . Naproxen Sodium Other (See Comments)    Other reaction(s): Other (See Comments) Stomach ulcers Stomach ulcers  . Penicillins     Other reaction(s): UNKNOWN  . Sulfamethoxazole-Trimethoprim Rash    Family History: Family History  Problem Relation Age of Onset  . Gait disorder Sister     Stiff Persons Syndrome  . Diabetes Sister   . Gallbladder disease Sister   . Breast cancer Sister     33's  . Thrombosis Sister   . Diabetes Paternal Grandfather   . Kidney disease Neg Hx   . Prostate cancer Neg Hx   . Bladder  Cancer Neg Hx     Social History:  reports that she has never smoked. She has never used smokeless tobacco. She reports that she does not drink alcohol or use drugs.  ROS: UROLOGY Frequent Urination?: No Hard to postpone urination?: No Burning/pain with urination?: No Get up at night to urinate?: Yes Leakage of urine?: No Urine stream starts and stops?: No Trouble starting stream?: No Do you have to strain to urinate?: No Blood in urine?: No Urinary tract infection?: No Sexually transmitted disease?: No Injury to kidneys or bladder?: No Painful intercourse?: No Weak stream?: No Currently pregnant?: No Vaginal bleeding?: No Last menstrual period?: n  Gastrointestinal Nausea?: No Vomiting?: No Indigestion/heartburn?: No Diarrhea?: No Constipation?: No  Constitutional Fever: No Night sweats?: No Weight loss?: No Fatigue?: No  Skin Skin rash/lesions?: No Itching?: No  Eyes Blurred vision?: No Double vision?: No  Ears/Nose/Throat Sore throat?: No Sinus problems?: No  Hematologic/Lymphatic Swollen glands?: No Easy bruising?: Yes  Cardiovascular Leg swelling?: No Chest pain?: No  Respiratory Cough?: No Shortness of breath?: No  Endocrine Excessive thirst?: No  Musculoskeletal Back pain?: No Joint pain?: No  Neurological Dizziness?: No  Psychologic Depression?: No Anxiety?: No  Physical Exam: BP (!) 170/97   Pulse 98   Ht 5\' 4"  (1.626 m)   Wt 69.1 kg (152 lb 4.8 oz)   BMI 26.14 kg/m   Constitutional:  Alert and oriented, No acute distress. HEENT: [place] AT, moist mucus membranes.  Trachea midline, no masses. Cardiovascular: No clubbing, cyanosis, or edema. Respiratory: Normal respiratory effort, no increased work of breathing. GI: Abdomen is soft, nontender, nondistended, no abdominal masses GU: No CVA tenderness. Skin: No rashes, bruises or suspicious lesions. Neurologic: Grossly intact, no focal deficits, moving all 4  extremities. Psychiatric: Normal mood and affect.  Laboratory Data: Lab Results  Component Value Date   WBC 8.5 12/11/2013   HGB 13.4 12/19/2013   HCT 45.6 12/11/2013   MCV 87 12/11/2013   PLT 288 12/11/2013    Lab Results  Component Value Date   CREATININE 1.01 12/11/2013    No results found for: PSA  No results found for: TESTOSTERONE  No results found for: HGBA1C  Urinalysis    Component Value Date/Time   APPEARANCEUR Cloudy (A) 12/01/2015 1141   GLUCOSEU Negative 12/01/2015 1141   BILIRUBINUR Negative 12/01/2015 1141   PROTEINUR Negative 12/01/2015 1141   NITRITE Negative 12/01/2015 1141   LEUKOCYTESUR 2+ (A) 12/01/2015 1141     Assessment & Plan:    1. Female genuine stress incontinence - stable. PVR reasonable.  - Urinalysis, Complete - Bladder Scan (Post Void Residual) in office  2. Cystocele, midline -stable  3. Recurrent UTI - improved. Discussed she may  have periods of asymptomatic bacteriuria (urine odor, cloudiness) that does need abx per ID guidelines unless she has symptoms of an infection (dysuria, bladder pain, fever, etc.).    No Follow-up on file.  Jerilee FieldESKRIDGE, Sharifah Champine, MD  [facility] [address], [address] [place], [place] [id number] [phone]

## 2017-03-22 ENCOUNTER — Other Ambulatory Visit: Payer: Self-pay | Admitting: Internal Medicine

## 2017-03-22 DIAGNOSIS — Z1231 Encounter for screening mammogram for malignant neoplasm of breast: Secondary | ICD-10-CM

## 2017-07-11 ENCOUNTER — Ambulatory Visit
Admission: RE | Admit: 2017-07-11 | Discharge: 2017-07-11 | Disposition: A | Discharge status: Home / Self Care | Payer: Medicare Other | Source: Ambulatory Visit | Attending: Internal Medicine | Admitting: Internal Medicine

## 2017-07-11 DIAGNOSIS — Z1231 Encounter for screening mammogram for malignant neoplasm of breast: Secondary | ICD-10-CM | POA: Diagnosis present

## 2017-08-16 ENCOUNTER — Encounter: Payer: Self-pay | Admitting: Obstetrics and Gynecology

## 2017-08-16 ENCOUNTER — Ambulatory Visit (INDEPENDENT_AMBULATORY_CARE_PROVIDER_SITE_OTHER): Payer: Medicare Other | Admitting: Obstetrics and Gynecology

## 2017-08-16 VITALS — BP 136/75 | HR 99 | Ht 64.0 in | Wt 143.9 lb

## 2017-08-16 DIAGNOSIS — N816 Rectocele: Secondary | ICD-10-CM

## 2017-08-16 DIAGNOSIS — K469 Unspecified abdominal hernia without obstruction or gangrene: Secondary | ICD-10-CM | POA: Diagnosis not present

## 2017-08-16 DIAGNOSIS — N8111 Cystocele, midline: Secondary | ICD-10-CM

## 2017-08-16 NOTE — Patient Instructions (Signed)
1.  Return in 1 year for follow-up on prolapse. 2.  Return as needed if we prolapse symptoms worsen

## 2017-08-16 NOTE — Progress Notes (Signed)
Chief complaint: 1.  Pelvic organ prolapse 2.  History of anterior colporrhaphy with enterocele ligation and pubovaginal sling in 12/18/2013  Patient presents today for yearly follow-up.  She continues to be functional at age 83.  She is voiding every couple hours, but does need to initiate her urine stream with bending over.  Bowel function waxes and wanes between constipation and diarrhea especially when she drinks caffeinated beverages.  She feels that her prolapse is possibly slightly worse than last year but she is still very functional.  She is still going to exercise classes 3 times a week and she drives locally.  Past medical history, past surgical history, problem list, medications, and allergies are reviewed  OBJECTIVE: BP 136/75   Pulse 99   Ht 5\' 4"  (1.626 m)   Wt 143 lb 14.4 oz (65.3 kg)   BMI 24.70 kg/m  Pleasant well-appearing elderly female in no acute distress.  Alert and oriented. Abdomen: Soft, nontender without organomegaly Pelvic exam: External genitalia-normal BUS-urethral caruncle present Vagina-second-degree cystocele B, made slightly worse with Valsalva; enterocele present; rectocele present, mild; vaginal mucosa with fair estrogen effect Bimanual exam reveals no masses or tenderness Rectovaginal-external hemorrhoids are noted  ASSESSMENT: 1.  History of pelvic organ prolapse, status post anterior colporrhaphy with enterocele ligation and pubovaginal sling in 12/18/2013 2.  Second to third-degree cystocele, minimally symptomatic 3.  Enterocele, minimally symptomatic 4.  Rectocele, mild, minimally symptomatic 5.  Vaginal atrophy, stable  PLAN: 1.  Return in 1 year for follow-up or sooner if gynecologic symptoms worsen 2.  Continue with timed voiding; consider a Crede maneuver to help initiate urine stream 3.  Maintain follow-up with urology. 4.  Continue to see Dr. Judithann SheenSparks for twice yearly internal medicine monitoring.  A total of 15 minutes were spent  face-to-face with the patient during this encounter and over half of that time dealt with counseling and coordination of care.  Herold HarmsMartin A Tye Vigo, MD  Note: This dictation was prepared with Dragon dictation along with smaller phrase technology. Any transcriptional errors that result from this process are unintentional.

## 2017-12-01 ENCOUNTER — Ambulatory Visit: Payer: Medicare Other

## 2018-06-15 ENCOUNTER — Other Ambulatory Visit: Payer: Self-pay | Admitting: Internal Medicine

## 2018-06-15 DIAGNOSIS — Z1231 Encounter for screening mammogram for malignant neoplasm of breast: Secondary | ICD-10-CM

## 2018-07-12 ENCOUNTER — Ambulatory Visit
Admission: RE | Admit: 2018-07-12 | Discharge: 2018-07-12 | Disposition: A | Discharge status: Home / Self Care | Payer: Medicare Other | Source: Ambulatory Visit | Attending: Internal Medicine | Admitting: Internal Medicine

## 2018-07-12 DIAGNOSIS — Z1231 Encounter for screening mammogram for malignant neoplasm of breast: Secondary | ICD-10-CM | POA: Insufficient documentation

## 2018-07-13 ENCOUNTER — Other Ambulatory Visit: Payer: Self-pay | Admitting: Internal Medicine

## 2018-07-13 DIAGNOSIS — R928 Other abnormal and inconclusive findings on diagnostic imaging of breast: Secondary | ICD-10-CM

## 2018-07-13 DIAGNOSIS — N6310 Unspecified lump in the right breast, unspecified quadrant: Secondary | ICD-10-CM

## 2018-07-25 ENCOUNTER — Ambulatory Visit
Admission: RE | Admit: 2018-07-25 | Discharge: 2018-07-25 | Disposition: A | Discharge status: Home / Self Care | Payer: Medicare Other | Source: Ambulatory Visit | Attending: Internal Medicine | Admitting: Internal Medicine

## 2018-07-25 DIAGNOSIS — R928 Other abnormal and inconclusive findings on diagnostic imaging of breast: Secondary | ICD-10-CM | POA: Insufficient documentation

## 2018-07-25 DIAGNOSIS — N6310 Unspecified lump in the right breast, unspecified quadrant: Secondary | ICD-10-CM | POA: Insufficient documentation

## 2018-08-22 ENCOUNTER — Encounter: Payer: Medicare Other | Admitting: Obstetrics and Gynecology

## 2019-07-24 ENCOUNTER — Other Ambulatory Visit: Payer: Self-pay | Admitting: Internal Medicine

## 2019-07-24 DIAGNOSIS — Z1231 Encounter for screening mammogram for malignant neoplasm of breast: Secondary | ICD-10-CM

## 2019-10-25 ENCOUNTER — Ambulatory Visit
Admission: RE | Admit: 2019-10-25 | Discharge: 2019-10-25 | Disposition: A | Discharge status: Home / Self Care | Payer: Medicare Other | Source: Ambulatory Visit | Attending: Internal Medicine | Admitting: Internal Medicine

## 2019-10-25 DIAGNOSIS — Z1231 Encounter for screening mammogram for malignant neoplasm of breast: Secondary | ICD-10-CM | POA: Diagnosis present

## 2020-09-24 ENCOUNTER — Other Ambulatory Visit: Payer: Self-pay | Admitting: Internal Medicine

## 2020-09-24 DIAGNOSIS — Z1231 Encounter for screening mammogram for malignant neoplasm of breast: Secondary | ICD-10-CM

## 2020-10-11 IMAGING — MG DIGITAL SCREENING BILAT W/ TOMO W/ CAD
8 series · 9 of 24 positions shown · non-contrast
Comparison: Previous exam(s).

CLINICAL DATA: Screening.

EXAM:
DIGITAL SCREENING BILATERAL MAMMOGRAM WITH TOMO AND CAD

[L CC synth-2D]
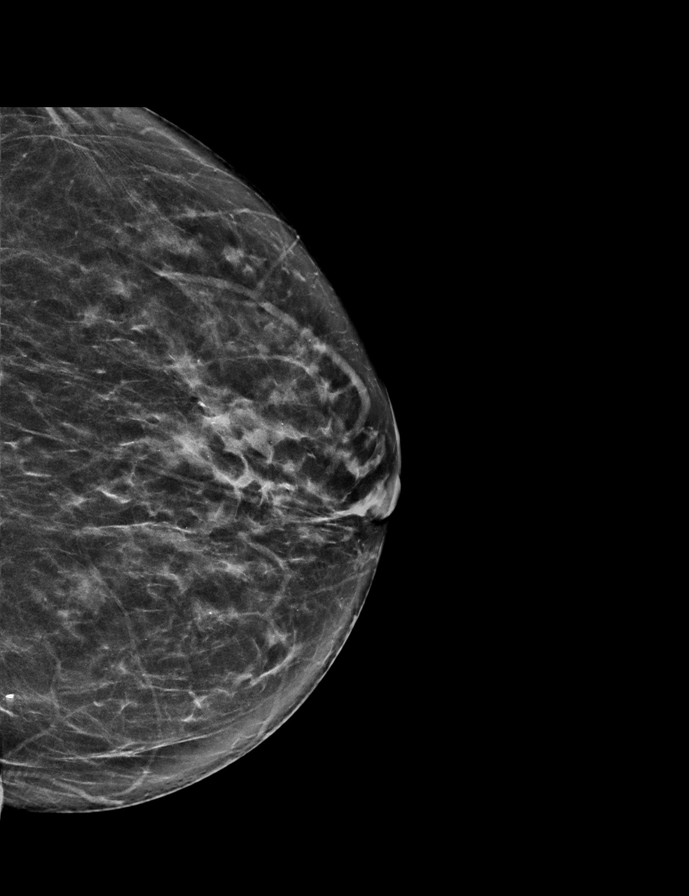

[L MLO synth-2D]
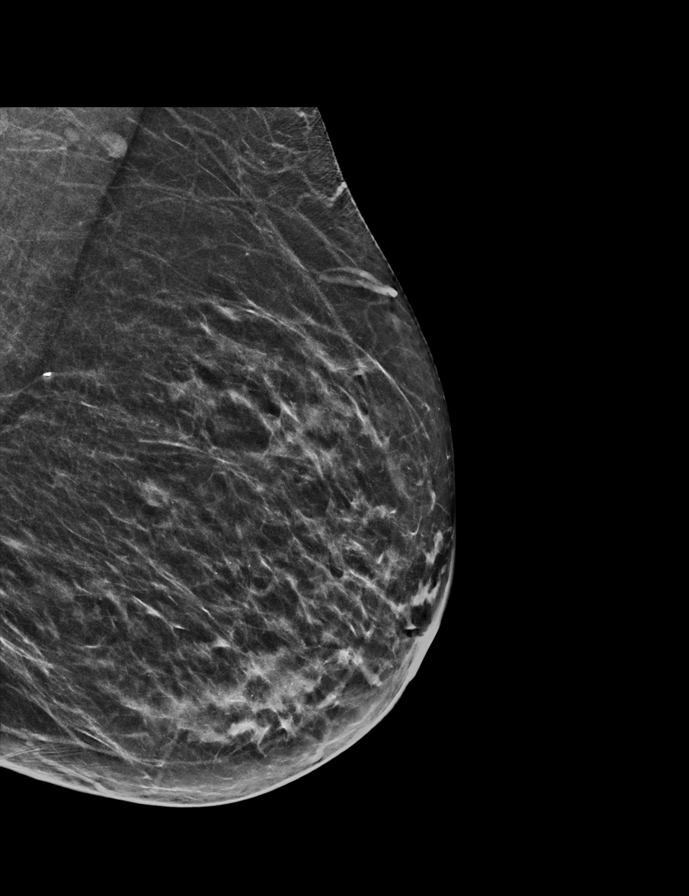

[R CC synth-2D]
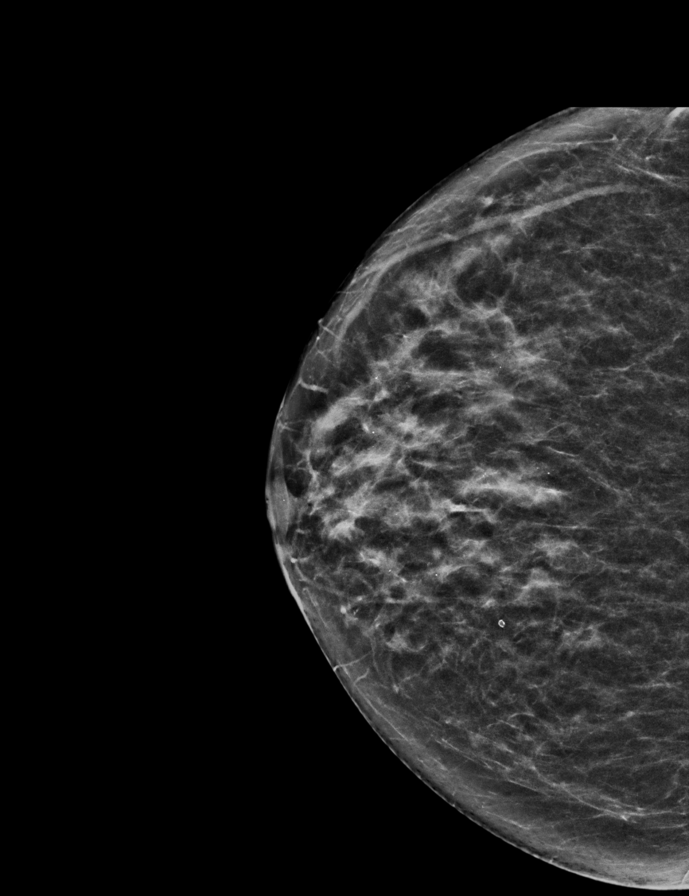

[R MLO synth-2D]
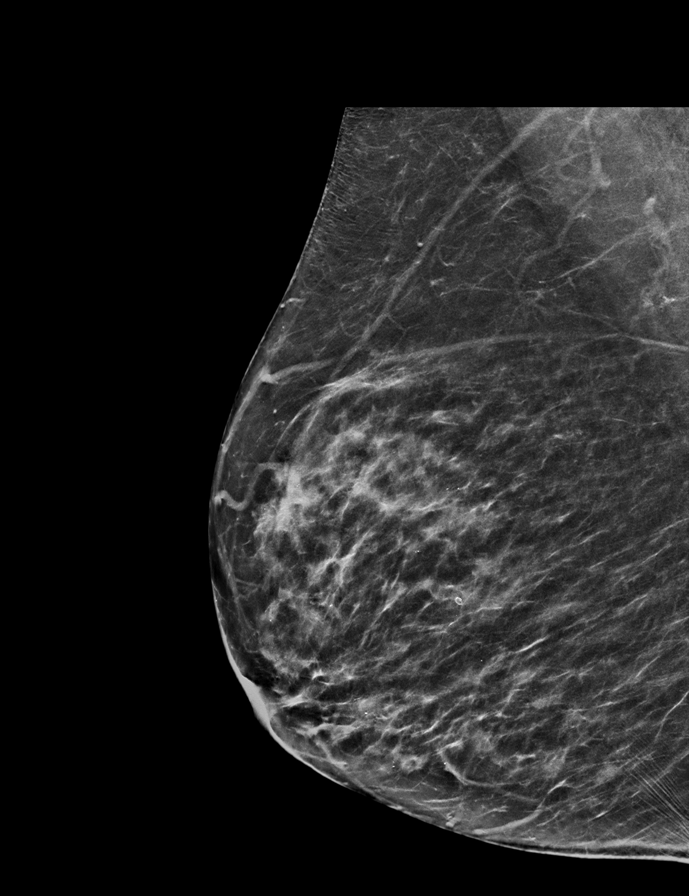

[R CC tomo · 2 of 57 frames shown]
[frame 19/57]
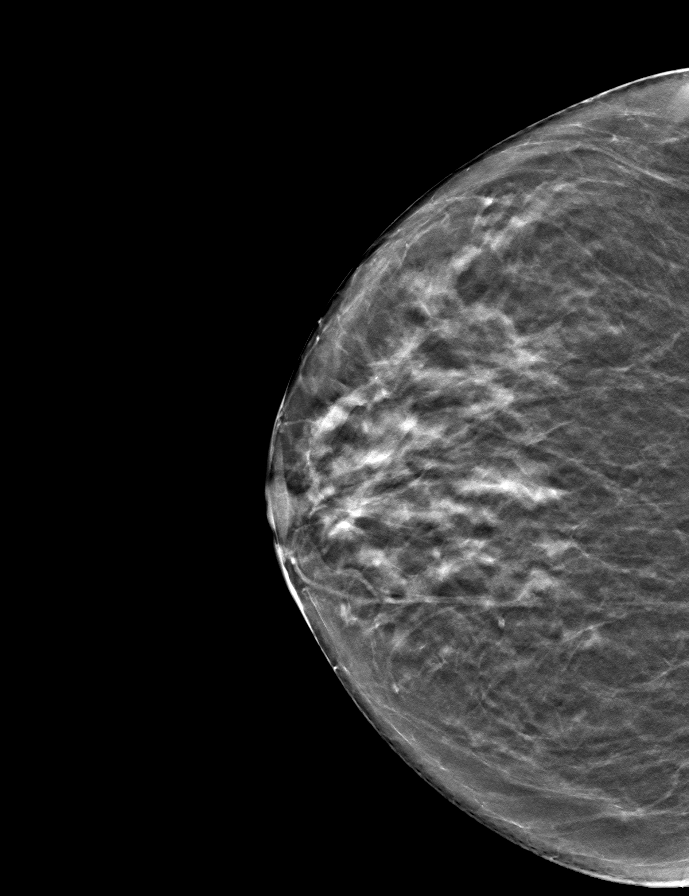
[frame 29/57]
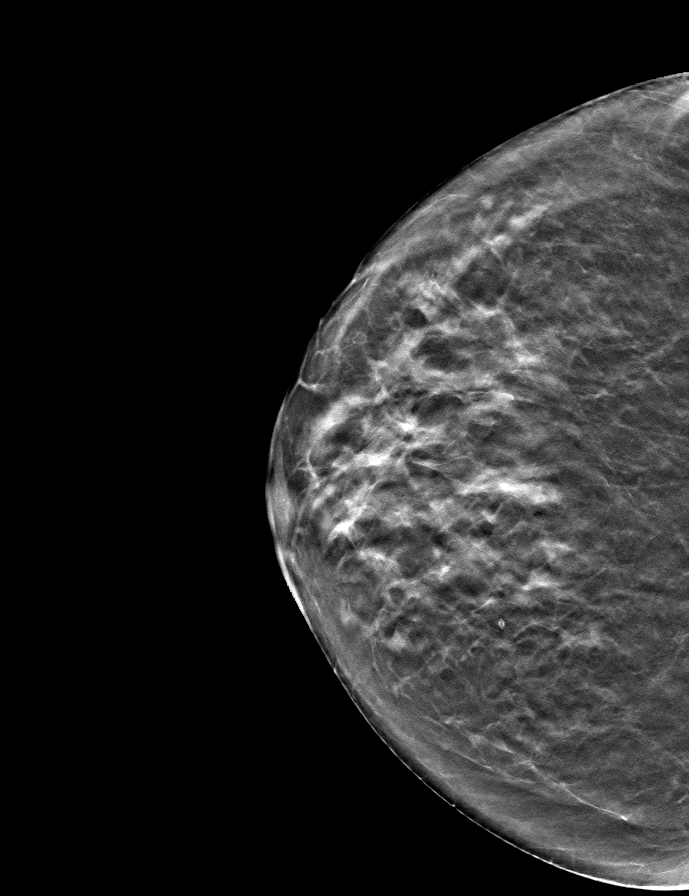

[R MLO tomo · tomo slice 33/65.0]
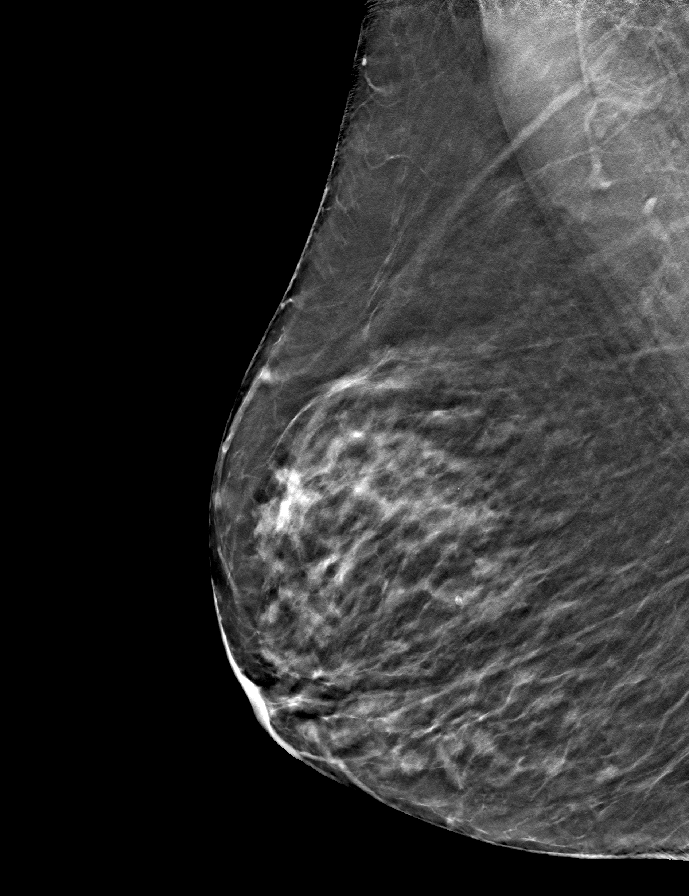

[L MLO tomo · tomo slice 27/54.0]
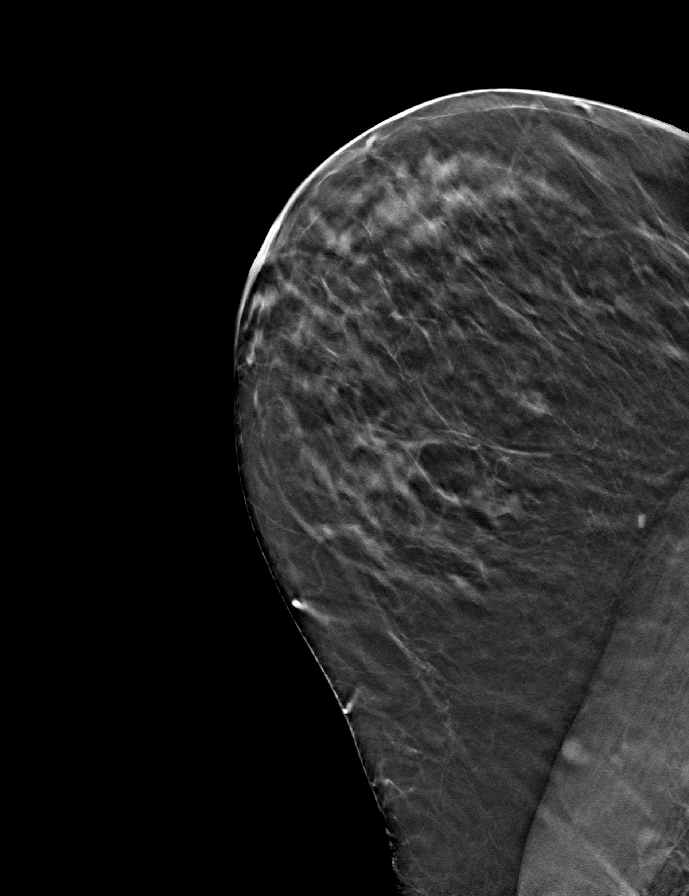

[L CC tomo · tomo slice 29/58.0]
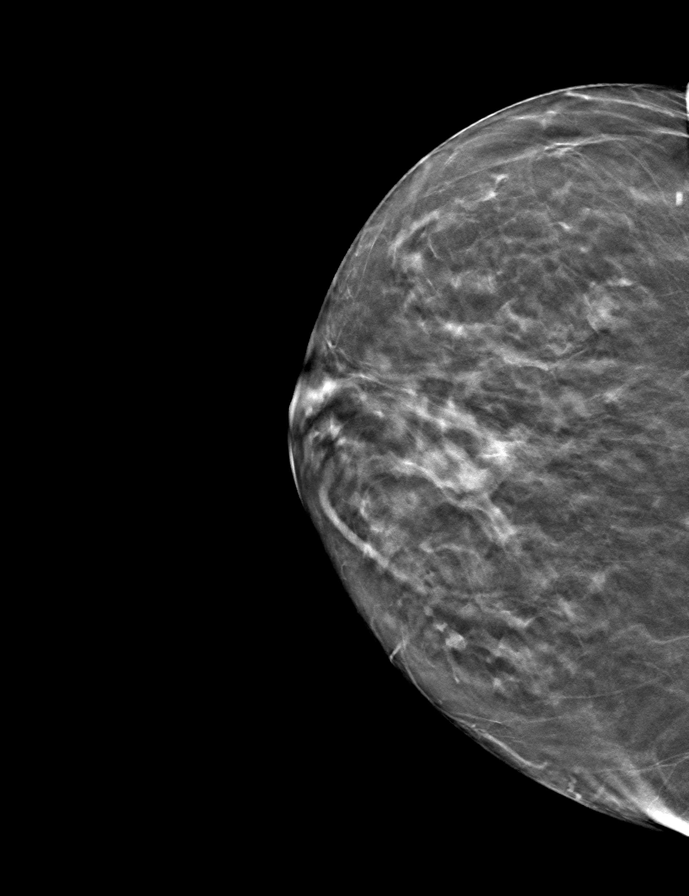

[9 of 24 positions shown; findings below may reference images not displayed]

ACR Breast Density Category b: There are scattered areas of
fibroglandular density.
FINDINGS: There are no findings suspicious for malignancy. Images were
processed with CAD.
IMPRESSION: No mammographic evidence of malignancy. A result letter of this
screening mammogram will be mailed directly to the patient.

RECOMMENDATION:
Screening mammogram in one year. (Code:[id number])

BI-RADS CATEGORY  1: Negative.

## 2020-10-27 ENCOUNTER — Other Ambulatory Visit: Payer: Self-pay

## 2020-10-27 ENCOUNTER — Ambulatory Visit
Admission: RE | Admit: 2020-10-27 | Discharge: 2020-10-27 | Disposition: A | Discharge status: Home / Self Care | Payer: Medicare Other | Source: Ambulatory Visit | Attending: Internal Medicine | Admitting: Internal Medicine

## 2020-10-27 DIAGNOSIS — Z1231 Encounter for screening mammogram for malignant neoplasm of breast: Secondary | ICD-10-CM | POA: Diagnosis present

## 2020-12-19 ENCOUNTER — Other Ambulatory Visit: Payer: Self-pay | Admitting: Internal Medicine

## 2020-12-19 ENCOUNTER — Other Ambulatory Visit (HOSPITAL_COMMUNITY): Payer: Self-pay | Admitting: Internal Medicine

## 2020-12-19 DIAGNOSIS — R55 Syncope and collapse: Secondary | ICD-10-CM

## 2021-01-09 ENCOUNTER — Other Ambulatory Visit: Payer: Self-pay

## 2021-01-09 ENCOUNTER — Ambulatory Visit
Admission: RE | Admit: 2021-01-09 | Discharge: 2021-01-09 | Disposition: A | Discharge status: Home / Self Care | Payer: Medicare Other | Source: Ambulatory Visit | Attending: Internal Medicine | Admitting: Internal Medicine

## 2021-01-09 DIAGNOSIS — R55 Syncope and collapse: Secondary | ICD-10-CM | POA: Diagnosis present

## 2021-07-26 ENCOUNTER — Other Ambulatory Visit (HOSPITAL_COMMUNITY): Payer: Medicare Other

## 2021-07-26 ENCOUNTER — Emergency Department (HOSPITAL_COMMUNITY): Payer: Medicare Other

## 2021-07-26 ENCOUNTER — Other Ambulatory Visit: Payer: Self-pay

## 2021-07-26 ENCOUNTER — Inpatient Hospital Stay (HOSPITAL_COMMUNITY)
Admission: EM | Admit: 2021-07-26 | Discharge: 2021-08-18 | DRG: 064 | Disposition: E | Payer: Medicare Other | Attending: Student | Admitting: Student

## 2021-07-26 ENCOUNTER — Encounter (HOSPITAL_COMMUNITY): Payer: Self-pay | Admitting: Neurology

## 2021-07-26 DIAGNOSIS — G8194 Hemiplegia, unspecified affecting left nondominant side: Secondary | ICD-10-CM | POA: Diagnosis present

## 2021-07-26 DIAGNOSIS — Z9049 Acquired absence of other specified parts of digestive tract: Secondary | ICD-10-CM

## 2021-07-26 DIAGNOSIS — Z881 Allergy status to other antibiotic agents status: Secondary | ICD-10-CM

## 2021-07-26 DIAGNOSIS — Z7982 Long term (current) use of aspirin: Secondary | ICD-10-CM

## 2021-07-26 DIAGNOSIS — I629 Nontraumatic intracranial hemorrhage, unspecified: Secondary | ICD-10-CM

## 2021-07-26 DIAGNOSIS — Z20822 Contact with and (suspected) exposure to covid-19: Secondary | ICD-10-CM | POA: Diagnosis present

## 2021-07-26 DIAGNOSIS — R1310 Dysphagia, unspecified: Secondary | ICD-10-CM | POA: Diagnosis present

## 2021-07-26 DIAGNOSIS — M810 Age-related osteoporosis without current pathological fracture: Secondary | ICD-10-CM | POA: Diagnosis present

## 2021-07-26 DIAGNOSIS — R29810 Facial weakness: Secondary | ICD-10-CM | POA: Diagnosis present

## 2021-07-26 DIAGNOSIS — I610 Nontraumatic intracerebral hemorrhage in hemisphere, subcortical: Principal | ICD-10-CM | POA: Diagnosis present

## 2021-07-26 DIAGNOSIS — R471 Dysarthria and anarthria: Secondary | ICD-10-CM | POA: Diagnosis present

## 2021-07-26 DIAGNOSIS — M19042 Primary osteoarthritis, left hand: Secondary | ICD-10-CM | POA: Diagnosis present

## 2021-07-26 DIAGNOSIS — Z66 Do not resuscitate: Secondary | ICD-10-CM | POA: Diagnosis present

## 2021-07-26 DIAGNOSIS — Z90711 Acquired absence of uterus with remaining cervical stump: Secondary | ICD-10-CM

## 2021-07-26 DIAGNOSIS — G936 Cerebral edema: Secondary | ICD-10-CM | POA: Diagnosis present

## 2021-07-26 DIAGNOSIS — G629 Polyneuropathy, unspecified: Secondary | ICD-10-CM | POA: Diagnosis present

## 2021-07-26 DIAGNOSIS — T17908A Unspecified foreign body in respiratory tract, part unspecified causing other injury, initial encounter: Secondary | ICD-10-CM | POA: Diagnosis not present

## 2021-07-26 DIAGNOSIS — G20 Parkinson's disease: Secondary | ICD-10-CM

## 2021-07-26 DIAGNOSIS — W19XXXA Unspecified fall, initial encounter: Secondary | ICD-10-CM | POA: Diagnosis present

## 2021-07-26 DIAGNOSIS — G911 Obstructive hydrocephalus: Secondary | ICD-10-CM | POA: Diagnosis present

## 2021-07-26 DIAGNOSIS — H9193 Unspecified hearing loss, bilateral: Secondary | ICD-10-CM | POA: Diagnosis present

## 2021-07-26 DIAGNOSIS — Z87440 Personal history of urinary (tract) infections: Secondary | ICD-10-CM

## 2021-07-26 DIAGNOSIS — Z882 Allergy status to sulfonamides status: Secondary | ICD-10-CM

## 2021-07-26 DIAGNOSIS — R296 Repeated falls: Secondary | ICD-10-CM | POA: Diagnosis present

## 2021-07-26 DIAGNOSIS — Z888 Allergy status to other drugs, medicaments and biological substances status: Secondary | ICD-10-CM

## 2021-07-26 DIAGNOSIS — R4182 Altered mental status, unspecified: Secondary | ICD-10-CM | POA: Diagnosis present

## 2021-07-26 DIAGNOSIS — R54 Age-related physical debility: Secondary | ICD-10-CM | POA: Diagnosis present

## 2021-07-26 DIAGNOSIS — Z515 Encounter for palliative care: Secondary | ICD-10-CM | POA: Diagnosis not present

## 2021-07-26 DIAGNOSIS — F32A Depression, unspecified: Secondary | ICD-10-CM | POA: Diagnosis present

## 2021-07-26 DIAGNOSIS — Z7189 Other specified counseling: Secondary | ICD-10-CM

## 2021-07-26 DIAGNOSIS — R414 Neurologic neglect syndrome: Secondary | ICD-10-CM | POA: Diagnosis present

## 2021-07-26 DIAGNOSIS — I161 Hypertensive emergency: Secondary | ICD-10-CM | POA: Diagnosis present

## 2021-07-26 DIAGNOSIS — L8990 Pressure ulcer of unspecified site, unspecified stage: Secondary | ICD-10-CM | POA: Diagnosis present

## 2021-07-26 DIAGNOSIS — E785 Hyperlipidemia, unspecified: Secondary | ICD-10-CM | POA: Diagnosis present

## 2021-07-26 DIAGNOSIS — K219 Gastro-esophageal reflux disease without esophagitis: Secondary | ICD-10-CM | POA: Diagnosis present

## 2021-07-26 DIAGNOSIS — M19041 Primary osteoarthritis, right hand: Secondary | ICD-10-CM | POA: Diagnosis present

## 2021-07-26 DIAGNOSIS — Z885 Allergy status to narcotic agent status: Secondary | ICD-10-CM

## 2021-07-26 DIAGNOSIS — Z79899 Other long term (current) drug therapy: Secondary | ICD-10-CM

## 2021-07-26 DIAGNOSIS — I619 Nontraumatic intracerebral hemorrhage, unspecified: Secondary | ICD-10-CM | POA: Diagnosis present

## 2021-07-26 DIAGNOSIS — I10 Essential (primary) hypertension: Secondary | ICD-10-CM | POA: Diagnosis present

## 2021-07-26 DIAGNOSIS — R29715 NIHSS score 15: Secondary | ICD-10-CM | POA: Diagnosis present

## 2021-07-26 DIAGNOSIS — I615 Nontraumatic intracerebral hemorrhage, intraventricular: Secondary | ICD-10-CM | POA: Diagnosis present

## 2021-07-26 DIAGNOSIS — F419 Anxiety disorder, unspecified: Secondary | ICD-10-CM | POA: Diagnosis present

## 2021-07-26 DIAGNOSIS — L89151 Pressure ulcer of sacral region, stage 1: Secondary | ICD-10-CM | POA: Diagnosis present

## 2021-07-26 DIAGNOSIS — Z880 Allergy status to penicillin: Secondary | ICD-10-CM

## 2021-07-26 LAB — COMPREHENSIVE METABOLIC PANEL
ALT: 11 U/L (ref 0–44)
AST: 20 U/L (ref 15–41)
Albumin: 3.3 g/dL — ABNORMAL LOW (ref 3.5–5.0)
Alkaline Phosphatase: 73 U/L (ref 38–126)
Anion gap: 12 (ref 5–15)
BUN: 15 mg/dL (ref 8–23)
CO2: 24 mmol/L (ref 22–32)
Calcium: 9.3 mg/dL (ref 8.9–10.3)
Chloride: 103 mmol/L (ref 98–111)
Creatinine, Ser: 0.87 mg/dL (ref 0.44–1.00)
GFR, Estimated: >60 mL/min (ref >60)
Glucose, Bld: 141 mg/dL — ABNORMAL HIGH (ref 70–99)
Potassium: 3.8 mmol/L (ref 3.5–5.1)
Sodium: 139 mmol/L (ref 135–145)
Total Bilirubin: 1.1 mg/dL (ref 0.3–1.2)
Total Protein: 7 g/dL (ref 6.5–8.1)

## 2021-07-26 LAB — SODIUM: Sodium: 142 mmol/L (ref 135–145)

## 2021-07-26 LAB — DIFFERENTIAL
Abs Immature Granulocytes: 0.06 10*3/uL (ref 0.00–0.07)
Basophils Absolute: 0 10*3/uL (ref 0.0–0.1)
Basophils Relative: 0 %
Eosinophils Absolute: 0 10*3/uL (ref 0.0–0.5)
Eosinophils Relative: 0 %
Immature Granulocytes: 1 %
Lymphocytes Relative: 15 %
Lymphs Abs: 1.6 10*3/uL (ref 0.7–4.0)
Monocytes Absolute: 0.7 10*3/uL (ref 0.1–1.0)
Monocytes Relative: 7 %
Neutro Abs: 8.1 10*3/uL — ABNORMAL HIGH (ref 1.7–7.7)
Neutrophils Relative %: 77 %

## 2021-07-26 LAB — CBG MONITORING, ED: Glucose-Capillary: 131 mg/dL — ABNORMAL HIGH (ref 70–99)

## 2021-07-26 LAB — ABO/RH: ABO/RH(D): A POS

## 2021-07-26 LAB — RESP PANEL BY RT-PCR (FLU A&B, COVID) ARPGX2
Influenza A by PCR: NEGATIVE
Influenza B by PCR: NEGATIVE
SARS Coronavirus 2 by RT PCR: NEGATIVE

## 2021-07-26 LAB — TYPE AND SCREEN
ABO/RH(D): A POS
Antibody Screen: NEGATIVE

## 2021-07-26 LAB — I-STAT CHEM 8, ED
BUN: 16 mg/dL (ref 8–23)
Calcium, Ion: 1.14 mmol/L — ABNORMAL LOW (ref 1.15–1.40)
Chloride: 104 mmol/L (ref 98–111)
Creatinine, Ser: 0.8 mg/dL (ref 0.44–1.00)
Glucose, Bld: 142 mg/dL — ABNORMAL HIGH (ref 70–99)
HCT: 40 % (ref 36.0–46.0)
Hemoglobin: 13.6 g/dL (ref 12.0–15.0)
Potassium: 3.7 mmol/L (ref 3.5–5.1)
Sodium: 140 mmol/L (ref 135–145)
TCO2: 25 mmol/L (ref 22–32)

## 2021-07-26 LAB — APTT: aPTT: 24 seconds (ref 24–36)

## 2021-07-26 LAB — HEMOGLOBIN A1C
Hgb A1c MFr Bld: 5.7 % — ABNORMAL HIGH (ref 4.8–5.6)
Mean Plasma Glucose: 116.89 mg/dL

## 2021-07-26 LAB — CBC
HCT: 41.1 % (ref 36.0–46.0)
Hemoglobin: 13.2 g/dL (ref 12.0–15.0)
MCH: 27.9 pg (ref 26.0–34.0)
MCHC: 32.1 g/dL (ref 30.0–36.0)
MCV: 86.9 fL (ref 80.0–100.0)
Platelets: 413 10*3/uL — ABNORMAL HIGH (ref 150–400)
RBC: 4.73 MIL/uL (ref 3.87–5.11)
RDW: 14.3 % (ref 11.5–15.5)
WBC: 10.5 10*3/uL (ref 4.0–10.5)
nRBC: 0 % (ref 0.0–0.2)

## 2021-07-26 LAB — MRSA NEXT GEN BY PCR, NASAL: MRSA by PCR Next Gen: NOT DETECTED

## 2021-07-26 LAB — PROTIME-INR
INR: 1 (ref 0.8–1.2)
Prothrombin Time: 13.1 seconds (ref 11.4–15.2)

## 2021-07-26 LAB — GLUCOSE, CAPILLARY: Glucose-Capillary: 130 mg/dL — ABNORMAL HIGH (ref 70–99)

## 2021-07-26 LAB — ETHANOL: Alcohol, Ethyl (B): <10 mg/dL (ref <10)

## 2021-07-26 MED ORDER — GLYCOPYRROLATE 0.2 MG/ML IJ SOLN: 0.2000 mg | INTRAMUSCULAR | Status: DC | PRN

## 2021-07-26 MED ORDER — STROKE: EARLY STAGES OF RECOVERY BOOK: Freq: Once | Status: DC

## 2021-07-26 MED ORDER — HYDROMORPHONE HCL 1 MG/ML IJ SOLN: 0.5000 mg | INTRAMUSCULAR | Status: DC | PRN

## 2021-07-26 MED ORDER — CLEVIDIPINE BUTYRATE 0.5 MG/ML IV EMUL
0.0000 mg/h | INTRAVENOUS | Status: DC
  Administered 2021-07-26: 1 mg/h via INTRAVENOUS

## 2021-07-26 MED ORDER — SODIUM CHLORIDE 3 % IV BOLUS
250.0000 mL | Freq: Once | INTRAVENOUS | Status: AC
  Administered 2021-07-26: 250 mL via INTRAVENOUS
  Filled 2021-07-26: qty 500

## 2021-07-26 MED ORDER — ONDANSETRON 4 MG PO TBDP: 4.0000 mg | ORAL_TABLET | Freq: Four times a day (QID) | ORAL | Status: DC | PRN

## 2021-07-26 MED ORDER — ACETAMINOPHEN 650 MG RE SUPP: 650.0000 mg | RECTAL | Status: DC | PRN

## 2021-07-26 MED ORDER — LABETALOL HCL 5 MG/ML IV SOLN
20.0000 mg | Freq: Once | INTRAVENOUS | Status: AC
  Administered 2021-07-26: 10 mg via INTRAVENOUS

## 2021-07-26 MED ORDER — LORAZEPAM 1 MG PO TABS: 1.0000 mg | ORAL_TABLET | ORAL | Status: DC | PRN

## 2021-07-26 MED ORDER — HALOPERIDOL LACTATE 2 MG/ML PO CONC
0.5000 mg | ORAL | Status: DC | PRN
  Filled 2021-07-26: qty 0.3

## 2021-07-26 MED ORDER — ONDANSETRON HCL 4 MG/2ML IJ SOLN: 4.0000 mg | Freq: Four times a day (QID) | INTRAMUSCULAR | Status: DC | PRN

## 2021-07-26 MED ORDER — HYDROMORPHONE HCL 1 MG/ML IJ SOLN
0.5000 mg | Freq: Two times a day (BID) | INTRAMUSCULAR | Status: DC | PRN
  Administered 2021-07-27: 0.5 mg via INTRAVENOUS
  Filled 2021-07-26: qty 0.5

## 2021-07-26 MED ORDER — GLYCOPYRROLATE 0.2 MG/ML IJ SOLN
0.2000 mg | INTRAMUSCULAR | Status: DC | PRN
  Filled 2021-07-26: qty 1

## 2021-07-26 MED ORDER — HALOPERIDOL LACTATE 5 MG/ML IJ SOLN
0.5000 mg | INTRAMUSCULAR | Status: DC | PRN
  Filled 2021-07-26: qty 1

## 2021-07-26 MED ORDER — HALOPERIDOL 0.5 MG PO TABS
0.5000 mg | ORAL_TABLET | ORAL | Status: DC | PRN
  Filled 2021-07-26: qty 1

## 2021-07-26 MED ORDER — BIOTENE DRY MOUTH MT LIQD: 15.0000 mL | OROMUCOSAL | Status: DC | PRN

## 2021-07-26 MED ORDER — SODIUM CHLORIDE 3 % IV SOLN
INTRAVENOUS | Status: DC
  Filled 2021-07-26 (×2): qty 500

## 2021-07-26 MED ORDER — GLYCOPYRROLATE 1 MG PO TABS
1.0000 mg | ORAL_TABLET | ORAL | Status: DC | PRN
  Filled 2021-07-26: qty 1

## 2021-07-26 MED ORDER — CHLORHEXIDINE GLUCONATE CLOTH 2 % EX PADS: 6.0000 | MEDICATED_PAD | Freq: Every day | CUTANEOUS | Status: DC

## 2021-07-26 MED ORDER — POLYVINYL ALCOHOL 1.4 % OP SOLN: 1.0000 [drp] | Freq: Four times a day (QID) | OPHTHALMIC | Status: DC | PRN

## 2021-07-26 MED ORDER — SENNOSIDES-DOCUSATE SODIUM 8.6-50 MG PO TABS: 1.0000 | ORAL_TABLET | Freq: Two times a day (BID) | ORAL | Status: DC

## 2021-07-26 MED ORDER — ACETAMINOPHEN 325 MG PO TABS: 650.0000 mg | ORAL_TABLET | ORAL | Status: DC | PRN

## 2021-07-26 MED ORDER — LORAZEPAM 2 MG/ML PO CONC: 1.0000 mg | ORAL | Status: DC | PRN

## 2021-07-26 MED ORDER — INSULIN ASPART 100 UNIT/ML IJ SOLN: 0.0000 [IU] | INTRAMUSCULAR | Status: DC

## 2021-07-26 MED ORDER — ACETAMINOPHEN 325 MG PO TABS: 650.0000 mg | ORAL_TABLET | Freq: Four times a day (QID) | ORAL | Status: DC | PRN

## 2021-07-26 MED ORDER — ACETAMINOPHEN 650 MG RE SUPP: 650.0000 mg | Freq: Four times a day (QID) | RECTAL | Status: DC | PRN

## 2021-07-26 MED ORDER — PANTOPRAZOLE SODIUM 40 MG IV SOLR: 40.0000 mg | Freq: Every day | INTRAVENOUS | Status: DC

## 2021-07-26 MED ORDER — DIPHENHYDRAMINE HCL 50 MG/ML IJ SOLN: 12.5000 mg | INTRAMUSCULAR | Status: DC | PRN

## 2021-07-26 MED ORDER — SODIUM CHLORIDE 0.9% FLUSH: 3.0000 mL | Freq: Once | INTRAVENOUS | Status: DC

## 2021-07-26 MED ORDER — ACETAMINOPHEN 160 MG/5ML PO SOLN: 650.0000 mg | ORAL | Status: DC | PRN

## 2021-07-26 MED ORDER — LORAZEPAM 2 MG/ML IJ SOLN
1.0000 mg | INTRAMUSCULAR | Status: DC | PRN
  Administered 2021-07-26 – 2021-07-28 (×3): 1 mg via INTRAVENOUS
  Filled 2021-07-26 (×3): qty 1

## 2021-07-26 NOTE — ED Provider Notes (Signed)
[facility] EMERGENCY DEPARTMENT Provider Note   CSN: [id number] Arrival date & time: 08/09/2021  1032     History No chief complaint on file.   Karen Manning is a 87 y.o. female   Patient presents emergency department with a chief complaint of slurred speech and left-sided weakness.  Patient reports that her symptoms started 0 800 while she was eating breakfast.  Patient denied any symptoms prior to this point.  Patient also endorses pain to her right eye.  HPI     Past Medical History:  Diagnosis Date   Arthritis    hands   Arthritis of knee, degenerative 05/02/2014   BP (high blood pressure) 04/10/2015   Constipation    Essential (primary) hypertension 05/02/2014   Female bladder prolapse    Gastric ulcer 04/10/2015   Headache, migraine 04/10/2015   High blood pressure    History of kidney stones    Hypercalcemia    Kidney stone    Parkinson disease ([place]) 06/26/2013   Recurrent UTI     Patient Active Problem List   Diagnosis Date Noted   Numbness and tingling 04/24/2015   Allergy to environmental factors 04/10/2015   Gastric ulcer 04/10/2015   H/O angioedema 04/10/2015   H/O renal calculi 04/10/2015   HLD (hyperlipidemia) 04/10/2015   BP (high blood pressure) 04/10/2015   Anemia, iron deficiency 04/10/2015   Headache, migraine 04/10/2015   Arthritis, degenerative 04/10/2015   Osteoporosis, post-menopausal 04/10/2015   Bloodgood disease 05/02/2014   Essential (primary) hypertension 05/02/2014   Arthritis of knee, degenerative 05/02/2014   Allergic state 05/02/2014   H/O disease 05/02/2014   H/O urinary stone 05/02/2014   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   Abnormal blood sugar 02/28/2014   Abnormal glucose level 02/28/2014   Female genuine stress incontinence 01/18/2014   Infection of urinary tract 12/25/2013   Parkinson disease ([place]) 06/26/2013   Cystocele, midline 07/03/2012   Incomplete bladder emptying 07/03/2012   Mixed  incontinence 07/03/2012   Pelvic relaxation due to vaginal vault prolapse, posthysterectomy 07/03/2012   Prolapse of vaginal vault after hysterectomy 07/03/2012    Past Surgical History:  Procedure Laterality Date   BLADDER SURGERY     sling   BREAST CYST ASPIRATION Left yrs ago   BREAST EXCISIONAL BIOPSY Left 1986   benign   CHOLECYSTECTOMY     KIDNEY STONE SURGERY     PARTIAL HYSTERECTOMY       OB History   No obstetric history on file.     Family History  Problem Relation Age of Onset   Gait disorder Sister        Stiff Persons Syndrome   Diabetes Sister    Gallbladder disease Sister    Breast cancer Sister        26's   Thrombosis Sister    Diabetes Paternal Grandfather    Kidney disease Neg Hx    Prostate cancer Neg Hx    Bladder Cancer Neg Hx     Social History   Tobacco Use   Smoking status: Never   Smokeless tobacco: Never  Vaping Use   Vaping Use: Never used  Substance Use Topics   Alcohol use: No   Drug use: No    Home Medications Prior to Admission medications   Medication Sig Start Date End Date Taking? Authorizing Provider  aspirin EC 81 MG tablet Take by mouth.    [provider]  cholecalciferol (VITAMIN D) 1000 UNITS tablet Take 1,000 Units by  mouth daily.    [provider]  Cranberry 125 MG TABS Take 1 tablet by mouth.    [provider]  ferrous sulfate 325 (65 FE) MG tablet Take 325 mg by mouth daily with breakfast.    [provider]  gabapentin (NEURONTIN) 300 MG capsule  06/14/13   [provider]  LORazepam (ATIVAN) 2 MG tablet  06/14/13   [provider]  Multiple Vitamin (MULTIVITAMIN) tablet Take 1 tablet by mouth daily.    [provider]  pantoprazole (PROTONIX) 40 MG tablet  06/03/13   [provider]  polyethylene glycol powder (GLYCOLAX/MIRALAX) powder Reported on 12/01/2015 04/24/13   [provider]  potassium chloride SA (K-DUR,KLOR-CON) 20 MEQ  tablet  06/14/13   [provider]    Allergies    Cephalexin, Morphine, Morphine sulfate er beads, Naproxen, Naproxen sodium, Penicillins, and Sulfamethoxazole-trimethoprim  Review of Systems   Review of Systems  Unable to perform ROS: Acuity of condition  Neurological:  Positive for facial asymmetry, speech difficulty, weakness and numbness.   Physical Exam Updated Vital Signs BP (!) 179/71 (BP Location: Right Arm)   Pulse 81   Temp 97.8 F (36.6 C) (Oral)   Resp 18   Wt 60.8 kg   SpO2 98%   BMI 23.01 kg/m   Physical Exam Vitals and nursing note reviewed.  Constitutional:      General: She is not in acute distress.    Appearance: She is not ill-appearing, toxic-appearing or diaphoretic.  HENT:     Head: Normocephalic and atraumatic.  Eyes:     General: No scleral icterus.       Right eye: No discharge.        Left eye: No discharge.     Extraocular Movements: Extraocular movements intact.     Pupils: Pupils are equal, round, and reactive to light.  Cardiovascular:     Rate and Rhythm: Normal rate.  Pulmonary:     Effort: Pulmonary effort is normal. No tachypnea, bradypnea or respiratory distress.     Breath sounds: Normal breath sounds. No stridor.  Abdominal:     Palpations: Abdomen is soft.     Tenderness: There is no abdominal tenderness.  Musculoskeletal:     Cervical back: Normal range of motion and neck supple. No rigidity.  Skin:    General: Skin is warm and dry.  Neurological:     General: No focal deficit present.     Mental Status: She is alert.     GCS: GCS eye subscore is 4. GCS verbal subscore is 5. GCS motor subscore is 6.     Cranial Nerves: Cranial nerve deficit and facial asymmetry present.     Motor: Weakness and pronator drift present. No tremor or seizure activity.     Comments: Dysarthria and left-sided facial droop noted.  Decreased sensation to left, arm, and leg.  Psychiatric:        Behavior: Behavior is cooperative.    ED  Results / Procedures / Treatments   Labs (all labs ordered are listed, but only abnormal results are displayed) Labs Reviewed  CBC - Abnormal; Notable for the following components:      Result Value   Platelets 413 (*)    All other components within normal limits  DIFFERENTIAL - Abnormal; Notable for the following components:   Neutro Abs 8.1 (*)    All other components within normal limits  I-STAT CHEM 8, ED - Abnormal; Notable for the following  components:   Glucose, Bld 142 (*)    Calcium, Ion 1.14 (*)    All other components within normal limits  CBG MONITORING, ED - Abnormal; Notable for the following components:   Glucose-Capillary 131 (*)    All other components within normal limits  PROTIME-INR  APTT  COMPREHENSIVE METABOLIC PANEL  ETHANOL  HEMOGLOBIN A1C  URINALYSIS, ROUTINE W REFLEX MICROSCOPIC  RAPID URINE DRUG SCREEN, HOSP PERFORMED  SODIUM  SODIUM  SODIUM  TYPE AND SCREEN    EKG None  Radiology CT HEAD CODE STROKE WO CONTRAST  Result Date: 07/21/2021 CLINICAL DATA:  Code stroke. 87 year old female with slurred speech. Fall 8 days ago. Left side weakness. EXAM: CT HEAD WITHOUT CONTRAST TECHNIQUE: Contiguous axial images were obtained from the base of the skull through the vertex without intravenous contrast. COMPARISON:  Brain MRI 04/30/2008.  Head CT 01/09/2021. FINDINGS: Brain: Hyperdense acute intracranial hemorrhage. There is oval intra-axial hemorrhage centered at the right thalamus encompassing 35 x 31 x 35 mm (AP by transverse by CC) for an estimated blood volume of 19 mL. Regional edema. Mild regional mass effect including trace leftward midline shift. Intraventricular extension. Moderate volume of intraventricular hemorrhage including to the 4th ventricle. And there is mild lateral ventriculomegaly, with some dilatation of the temporal horns. Mild transependymal edema suspected. Basilar cisterns remain patent. No subdural or other extra-axial blood  identified. Aside from the acute transependymal and cerebral edema gray-white matter differentiation appears stable. No superimposed acute cortically based infarct identified. Vascular: Calcified atherosclerosis at the skull base. Skull: Congenital incomplete ossification of the posterior C1 ring. Skull appears stable. No acute osseous abnormality identified. Sinuses/Orbits: Visualized paranasal sinuses and mastoids are stable and well aerated. Other: No orbit or scalp soft tissue injury identified. ASPECTS [facility] Stroke Program Early CT Score) Total score (0-10 with 10 being normal): Not applicable IMPRESSION: 1. Acute Right Thalamic Hemorrhage (19 mL) with moderate intraventricular extension of blood. Mild ventriculomegaly with transependymal edema. Regional right thalamic edema. Trace leftward midline shift. 2. These results were communicated to Dr. Curly Shores at 10:56 am on 08/11/2021 by text page via the [facility] messaging system. Electronically Signed   By: Genevie Ann M.D.   On: 08/01/2021 10:57    Procedures .Critical Care Performed by: Loni Beckwith, PA-C Authorized by: Loni Beckwith, PA-C   Critical care provider statement:    Critical care time (minutes):  30   Critical care was time spent personally by me on the following activities:  Discussions with consultants, evaluation of patient's response to treatment, examination of patient, ordering and performing treatments and interventions, ordering and review of laboratory studies, ordering and review of radiographic studies, pulse oximetry, re-evaluation of patient's condition, obtaining history from patient or surrogate and review of old charts   Medications Ordered in ED Medications  sodium chloride flush (NS) 0.9 % injection 3 mL (has no administration in time range)  labetalol (NORMODYNE) injection 20 mg (10 mg Intravenous Given 07/24/2021 1049)    And  clevidipine (CLEVIPREX) infusion 0.5 mg/mL (2 mg/hr Intravenous Rate/Dose Change  07/18/2021 1119)   stroke: mapping our early stages of recovery book (has no administration in time range)  acetaminophen (TYLENOL) tablet 650 mg (has no administration in time range)    Or  acetaminophen (TYLENOL) 160 MG/5ML solution 650 mg (has no administration in time range)    Or  acetaminophen (TYLENOL) suppository 650 mg (has no administration in time range)  senna-docusate (Senokot-S) tablet 1 tablet (has no administration in  time range)  pantoprazole (PROTONIX) injection 40 mg (has no administration in time range)  sodium chloride 3% (hypertonic) IV bolus 250 mL (250 mLs Intravenous New Bag/Given 08/12/2021 1110)  sodium chloride (hypertonic) 3 % solution (has no administration in time range)    ED Course  I have reviewed the triage vital signs and the nursing notes.  Pertinent labs & imaging results that were available during my care of the patient were reviewed by me and considered in my medical decision making (see chart for details).    MDM Rules/Calculators/A&P                           Alert 87 year old female no acute distress, nontoxic-appearing.  Presents to emergency department chief plaint of left-sided weakness and slurred speech.  Symptoms started 0 800.  Patient had no symptoms previous.  Patient was met at bridge with neurologist Dr. Curly Shores.  Airway patent patient able to handle oral secretions without difficulty.  Lungs clear to auscultation bilaterally.  Patient was brought directly to [facility] with neurology team for emergent stroke work-up.  Level 5 caveat applies due to critical condition of patient.  Noncontrast head CT shows acute right thalamic hemorrhage 19 mm with moderate intraventricular extension of blood.    Patient started on labetalol and Cleviprex.  Patient will be admitted for a CVA secondary to brain bleed.  Patient was discussed with and evaluated by Dr. Vanita Panda.   Final Clinical Impression(s) / ED Diagnoses Final diagnoses:  Intracranial  hemorrhage [facility])    Rx / DC Orders ED Discharge Orders     None        Dyann Ruddle 08/14/2021 1729    Carmin Muskrat, MD 07/30/21 1912

## 2021-07-26 NOTE — Consult Note (Signed)
Palliative Care Consult Note                                  Date: 07/25/2021   Patient Name: Karen Manning  DOB: 1929-11-16  MRN: [id number]  Age / Sex: 87 y.o., female  PCP: Idelle Crouch, MD Referring Physician: Karmen Bongo, MD  Reason for Consultation: Establishing goals of care and Terminal Care  HPI/Patient Profile: 87 y.o. female  with past medical history of essential hypertension, hyperlipidemia, hypercalcemia, migraine headache, GERD, anxiety, depression, osteoporosis, arthritis, recurrent UTI, peripheral neuropathy, and multiple recent falls admitted on 08/10/2021 with cc of left-sided weakness and slurred speech. The patient fell several days ago and hit her head on a bookcase but has done well with no symptoms since. This morning she woke around 6:00 am and was doing ok, but started not feeling well around 8:00 and hit her life alert. EMS responded and brought her to the hospital.  Found to have left facial droop and marked left-sided neglect with left hemiplegia. CT found right thalmamic hemorrhage with edema and trace midline shift. Neuro and neurosurgery consulted and feel this is sa significant event that will likely result in her demise. Family was offered ventriculostomy which they declined to transition to comfort care.  PMT was consulted for end-of-life care and support  Past Medical History:  Diagnosis Date   Arthritis    hands   Arthritis of knee, degenerative 05/02/2014   Constipation    Essential (primary) hypertension 05/02/2014   Female bladder prolapse    Gastric ulcer 04/10/2015   Headache, migraine 04/10/2015   History of kidney stones    Hypercalcemia    Kidney stone    Parkinson disease ([place]) 06/26/2013   Recurrent UTI     Subjective:   This NP Walden Field reviewed medical records, received report from team, assessed the patient and then meet at the patient's bedside to discuss diagnosis,  prognosis, GOC, EOL wishes disposition and options.  I met with The patient, her daughter Karn Cassis' husband Denice Paradise, and Ubaldo Glassing' daughter Caitlyn at the bedside. The patient has a daughter-in-law Ellard Artis who was not present for my visit (from deceased youngest son) who is an OT at this facility.   Concept of Palliative Care was introduced as specialized medical care for people and their families living with serious illness.  If focuses on providing relief from the symptoms and stress of a serious illness.  The goal is to improve quality of life for both the patient and the family. Values and goals of care important to patient and family were attempted to be elicited.  Created space and opportunity for patient  and family to explore thoughts and feelings regarding current medical situation   Natural trajectory and current clinical status were discussed. Questions and concerns addressed. Patient  encouraged to call with questions or concerns.    Patient/Family Understanding of Illness: They understand she had a recent fall and hit her head, has been having some BP issues lately (?orthostatic hypotension causing the fall) and recent memory issues. She did ok until this morning, even spoke with her daughter and daughter-in-law over the past few days with no noted changes. However, she hit her life alert around 8:00 when she wasn't feeling well (although at this point she doesn't remember this) and was brought to the hospital.  The patient's family understands she has had a large brain bleed and  they feel she would not want surgery. Also, they have had recent losses and it was clear at that time that the patient would only want comfort care at end of life.  Life Review: The patient was from [place]. She moved to [place] and worked for years with the [employer] and then later worked for [employer]. She has 2 sons, both of whom have passed, and a daughter who was at bedside today. She listens to  church services from [employer] every Sunday. She enjoys reading and word searches.  Patient Values: Comfort, dignity, faith, family  Today's Discussion: Discussed aggressive care versus comfort care with the patient's family. They agree she would only want comfort care at this juncture. They understand the brain bleed will likely result in the end of her life. I explained comfort care as NOT a discontinuation of care, but rather a shift from trying to fix the unfixable to focus on comfort, dignity, and happiness. They agreed this is what she would want. I reviewed medications to treat pain, dyspnea, anxiety, agitation, and excessive secretions. I explained they have decided to transfer her out of ICU and to 6N, which they agree to.  They have asked for a spiritual care consult and feel the patient would appreciate this.  I provided emotional and general support with therapeutic and active listening. Answered all questions and addressed all concerns.  ROS limited due to condition Review of Systems  Constitutional:        Denies pain in general  Gastrointestinal:  Negative for abdominal pain, nausea and vomiting.   Objective:   Primary Diagnoses: Present on Admission:  ICH (intracerebral hemorrhage) ([place])  Pressure injury of skin  Parkinson disease ([place])  HLD (hyperlipidemia)  Essential (primary) hypertension   Physical Exam Vitals and nursing note reviewed.  Constitutional:      Appearance: Normal appearance. She is normal weight.  HENT:     Head: Normocephalic and atraumatic.  Cardiovascular:     Rate and Rhythm: Normal rate.     Heart sounds: No murmur heard. Pulmonary:     Effort: Pulmonary effort is normal. No respiratory distress.     Breath sounds: No wheezing or rhonchi.  Abdominal:     General: Abdomen is flat. Bowel sounds are normal.     Palpations: Abdomen is soft.  Skin:    General: Skin is warm and dry.  Neurological:      Mental Status: She is alert.     Comments: Oriented x 2    Vital Signs:  BP (!) 149/67   Pulse 90   Temp 97.6 F (36.4 C) (Oral)   Resp (!) 23   Wt 60.8 kg   SpO2 97%   BMI 23.01 kg/m   Palliative Assessment/Data: 20%    Advanced Care Planning:   Primary Decision Maker: NEXT OF KIN  Code Status/Advance Care Planning: DNR  A discussion was had today regarding advanced directives. Concepts specific to code status, artifical feeding and hydration, continued IV antibiotics and rehospitalization was had.  The difference between a aggressive medical intervention path and a palliative comfort care path for this patient at this time was had.   Decisions/Changes to ACP: None at this time Remain DNR Shift to comfort care  Assessment & Plan:   Impression: 19 year olf female with large ICH, likely life-ending. Family has decided to shift to comfort care. Patient is awake and alert at this time. Anticipate decline in mental status with  her brain bleed. Denies pain, N/V at this time. No obvious anxiety or agitation today.  SUMMARY OF RECOMMENDATIONS   Comfort care [place] to transfer for 6N for comfort care Spiritual care consult Liberalize diet Liberalize visitation PMT will continue to follow  Symptom Management:  Dilaudid prn pain/dyspnea Ativan prn anxiety Haldol prn agitation Robinul for excessive secretions  Prognosis:  Hours - Days  Discharge Planning:  Anticipated Hospital Death   Discussed with: Medical team, nursing team, patient family   Thank you for allowing [place] to participate in the care of [place] JON PMT will continue to support holistically.  Time Total: 70 min  Greater than 50%  of this time was spent counseling and coordinating care related to the above assessment and plan.  Signed by: Walden Field, NP Palliative Medicine Team  Team Phone # [phone] (Nights/Weekends)  07/25/2021, 4:18 PM

## 2021-07-26 NOTE — Progress Notes (Signed)
Goals of care discussion held with patient's daughter and son-in-law.  We reviewed with Onalee Hua already discussed with Dr. [name] of neurosurgery, and they expressed good understanding of the patient's intracerebral hemorrhage and poor prognosis.  They expressed that they had watched the patient's husband suffer a great deal at the end of his life, and that the patient would prefer to transition to comfort and palliative care measures only at this time.  Brooke Dare MD-PhD [facility] [phone]  Available 7 AM to 7 PM, outside these hours please contact Neurologist on call listed on AMION

## 2021-07-26 NOTE — Consult Note (Signed)
Medical Consultation   Karen RASPBERRY  UEA:[id number]  DOB: [birth year]  DOA: 08/08/2021  PCP: [name], MD   Outpatient Specialists: None    Requesting physician: [name] - neurology  Reason for consultation: Comfort care.  ICH, family wants comfort.  Neurology requests assumption of care.   History of Present Illness: Karen Manning is an 87 y.o. female with h/o HTN and Parkinson's presenting with stroke symptoms, pressed her Life Alert.  She has L facial droop and marked L neglect with L hemiplegia.  The patient is without complaint and is currently alert and oriented to person and place.  Her daughter reports that they experienced end of life issues with the patient's husband and she is very clear that she would want comfort only.    Review of Systems:  ROS As per HPI otherwise review of systems negative, limited by circumstances.    Past Medical History: Past Medical History:  Diagnosis Date   Arthritis    hands   Arthritis of knee, degenerative 05/02/2014   Constipation    Essential (primary) hypertension 05/02/2014   Female bladder prolapse    Gastric ulcer 04/10/2015   Headache, migraine 04/10/2015   History of kidney stones    Hypercalcemia    Kidney stone    Parkinson disease (HCC) 06/26/2013   Recurrent UTI     Past Surgical History: Past Surgical History:  Procedure Laterality Date   BLADDER SURGERY     sling   BREAST CYST ASPIRATION Left yrs ago   BREAST EXCISIONAL BIOPSY Left 1986   benign   CHOLECYSTECTOMY     KIDNEY STONE SURGERY     PARTIAL HYSTERECTOMY       Allergies:   Allergies  Allergen Reactions   Cephalexin Other (See Comments)    Other reaction(s): UNKNOWN   Morphine     Other reaction(s): Unknown Other reaction(s): UNKNOWN   Morphine Sulfate Er Beads Other (See Comments)   Naproxen Other (See Comments)    Stomach ulcers   Naproxen Sodium Other (See Comments)    Other reaction(s): Other (See  Comments) Stomach ulcers Stomach ulcers   Penicillins     Other reaction(s): UNKNOWN   Sulfamethoxazole-Trimethoprim Rash     Social History:  reports that she has never smoked. She has never used smokeless tobacco. She reports that she does not drink alcohol and does not use drugs.   Family History: Family History  Problem Relation Age of Onset   Gait disorder Sister        Stiff Persons Syndrome   Diabetes Sister    Gallbladder disease Sister    Breast cancer Sister        63's   Thrombosis Sister    Diabetes Paternal Grandfather    Kidney disease Neg Hx    Prostate cancer Neg Hx    Bladder Cancer Neg Hx       Physical Exam: Vitals:   08-08-2021 1218 08/08/21 1219 2021-08-08 1230 2021-08-08 1245  BP: (!) 147/61  (!) 145/59 (!) 144/60  Pulse: 84  80 82  Resp: 18  18 14   Temp:  97.6 F (36.4 C)    TempSrc:  Oral    SpO2: 93%  94% 94%  Weight:        Constitutional: Alert and awake, oriented x2, not in any acute distress. Eyes: EOMI, irises appear normal, anicteric sclera,  ENMT: external ears and  nose appear normal, normal hearing, Lips appear normal, marked L facial droop Neck: neck appears normal, no masses CVS: S1-S2 clear, no murmur rubs or gallops, no LE edema, normal pedal pulses  Respiratory:  clear to auscultation bilaterally, no wheezing, rales or rhonchi. Respiratory effort normal. No accessory muscle use.  Abdomen: soft nontender, nondistended Musculoskeletal: : marked LUE/LLE weakness Neuro: L facial droop, L sided neglect, L hemiparesis Psych: judgement and insight appear blunted Skin: no rashes or lesions or ulcers, no induration or nodules    Data reviewed:  I have personally reviewed the recent labs and imaging studies  Pertinent Labs:   COVID/flu negative A1c 5.7 Glucose 141 Albumin 3.3 Unremarkable CBC   Inpatient Medications:   Scheduled Meds:   Continuous Infusions:     Radiological Exams on Admission: CT HEAD CODE STROKE WO  CONTRAST  Result Date: 07/25/2021 CLINICAL DATA:  Code stroke. 87 year old female with slurred speech. Fall 8 days ago. Left side weakness. EXAM: CT HEAD WITHOUT CONTRAST TECHNIQUE: Contiguous axial images were obtained from the base of the skull through the vertex without intravenous contrast. COMPARISON:  Brain MRI 04/30/2008.  Head CT 01/09/2021. FINDINGS: Brain: Hyperdense acute intracranial hemorrhage. There is oval intra-axial hemorrhage centered at the right thalamus encompassing 35 x 31 x 35 mm (AP by transverse by CC) for an estimated blood volume of 19 mL. Regional edema. Mild regional mass effect including trace leftward midline shift. Intraventricular extension. Moderate volume of intraventricular hemorrhage including to the 4th ventricle. And there is mild lateral ventriculomegaly, with some dilatation of the temporal horns. Mild transependymal edema suspected. Basilar cisterns remain patent. No subdural or other extra-axial blood identified. Aside from the acute transependymal and cerebral edema gray-white matter differentiation appears stable. No superimposed acute cortically based infarct identified. Vascular: Calcified atherosclerosis at the skull base. Skull: Congenital incomplete ossification of the posterior C1 ring. Skull appears stable. No acute osseous abnormality identified. Sinuses/Orbits: Visualized paranasal sinuses and mastoids are stable and well aerated. Other: No orbit or scalp soft tissue injury identified. ASPECTS [facility] Stroke Program Early CT Score) Total score (0-10 with 10 being normal): Not applicable IMPRESSION: 1. Acute Right Thalamic Hemorrhage (19 mL) with moderate intraventricular extension of blood. Mild ventriculomegaly with transependymal edema. Regional right thalamic edema. Trace leftward midline shift. 2. These results were communicated to Dr. [name] at 10:56 am on 08/14/2021 by text page via the [facility] messaging system. Electronically Signed   By: Odessa Fleming M.D.    On: 08/09/2021 10:57    Impression/Recommendations Principal Problem:   End of life care Active Problems:   Parkinson disease (HCC)   Essential (primary) hypertension   HLD (hyperlipidemia)   ICH (intracerebral hemorrhage) (HCC)   Pressure injury of skin  * End of life care -Patient presenting with apparent stroke with marked L-sided symptoms -CT with acute R thalamic hemorrhage with edema and trace midline shift -Neurosurgery (Dr. [name]) and neurology (Dr. [name]) have consulted and agree that this is a significant event which will likely result in demise -After discussion with Dr. [name], family has decided to proceed with comfort care only -She was admitted to [facility] neuroICU but will be transferred to palliative level of care for comfort care and palliative care consult -Anticipate in-hospital demise -Comfort care order set utilized -Pain control with dilaudid as needed, would transition to a drip if needed but patient does not appear to be in pain at this time      DVT prophylaxis: None - comfort measures Code Status:  DNR - confirmed with family Family Communication: Daughter and son-in-law were present throughout evaluation Disposition Plan: Anticipate in-hospital death Consults called: Neurosurgery; Neurology; palliative care Admission status: Admit - It is my clinical opinion that admission to INPATIENT is reasonable and necessary because of the expectation that this patient will require hospital care that crosses at least 2 midnights to treat this condition based on the medical complexity of the problems presented.  Given the aforementioned information, the predictability of an adverse outcome is felt to be significant.     Thank you for this consultation.  Our [facility] hospitalist team will assume care of the patient at this time.   Time Spent: 50 minutes  Jonah Blue M.D. [facility] 08-17-21, 3:13 PM

## 2021-07-26 NOTE — ED Triage Notes (Signed)
[facility] reports pt coming from [facility] [place]. Pt reports starting feeling bad with weakness, slurred speech and leaning to the left around 0800. Upon EMS arrival pt flaccid on the left side, slurred speech and left side facial droop. Pt had a fall 8 days ago where she hit her head.

## 2021-07-26 NOTE — ED Notes (Signed)
ED TO INPATIENT HANDOFF REPORT  ED Nurse Name and Phone #: Delice Bison [phone]  S Name/Age/Gender Karen Manning 87 y.o. female Room/Bed: 026C/026C  Code Status   Code Status: DNR  Home/SNF/Other   Patient oriented to: self, place, time, and situation Is this baseline? Yes   Triage Complete: Triage complete  Chief Complaint ICH (intracerebral hemorrhage) (HCC) [I61.9]  Triage Note [facility] reports pt coming from [facility]. Pt reports starting feeling bad with weakness, slurred speech and leaning to the left around 0800. Upon EMS arrival pt flaccid on the left side, slurred speech and left side facial droop. Pt had a fall 8 days ago where she hit her head.   Allergies Allergies  Allergen Reactions   Cephalexin Other (See Comments)    Other reaction(s): UNKNOWN   Morphine     Other reaction(s): Unknown Other reaction(s): UNKNOWN   Morphine Sulfate Er Beads Other (See Comments)   Naproxen Other (See Comments)    Stomach ulcers   Naproxen Sodium Other (See Comments)    Other reaction(s): Other (See Comments) Stomach ulcers Stomach ulcers   Penicillins     Other reaction(s): UNKNOWN   Sulfamethoxazole-Trimethoprim Rash    Level of Care/Admitting Diagnosis ED Disposition     ED Disposition  Admit   Condition  --   Comment  Hospital Area: [facility] [[id number]]  Level of Care: ICU [6]  May admit patient to [facility] or [facility] if equivalent level of care is available:: No  Covid Evaluation: Asymptomatic Screening Protocol (No Symptoms)  Diagnosis: ICH (intracerebral hemorrhage) [facility]) [[id number]]  Admitting Physician: Gordy Councilman [[id number]]  Attending Physician: Gabriel Rung, MD [[id number]]  Estimated length of stay: past midnight tomorrow  Certification:: I certify this patient will need inpatient services for at least 2 midnights          B Medical/Surgery History Past Medical History:  Diagnosis Date   Arthritis    hands    Arthritis of knee, degenerative 05/02/2014   BP (high blood pressure) 04/10/2015   Constipation    Essential (primary) hypertension 05/02/2014   Female bladder prolapse    Gastric ulcer 04/10/2015   Headache, migraine 04/10/2015   High blood pressure    History of kidney stones    Hypercalcemia    Kidney stone    Parkinson disease (HCC) 06/26/2013   Recurrent UTI    Past Surgical History:  Procedure Laterality Date   BLADDER SURGERY     sling   BREAST CYST ASPIRATION Left yrs ago   BREAST EXCISIONAL BIOPSY Left 1986   benign   CHOLECYSTECTOMY     KIDNEY STONE SURGERY     PARTIAL HYSTERECTOMY       A IV Location/Drains/Wounds Patient Lines/Drains/Airways Status     Active Line/Drains/Airways     Name Placement date Placement time Site Days   Peripheral IV 08-13-21 18 G 1" Right Antecubital 2021-08-13  --  Antecubital  less than 1   Peripheral IV 13-Aug-2021 Left Antecubital 08/13/2021  1120  Antecubital  less than 1            Intake/Output Last 24 hours  Intake/Output Summary (Last 24 hours) at 08/13/2021 1136 Last data filed at 08-13-2021 1130 Gross per 24 hour  Intake 163.17 ml  Output --  Net 163.17 ml    Labs/Imaging Results for orders placed or performed during the hospital encounter of 08-13-21 (from the past 48 hour(s))  Protime-INR     Status:  None   Collection Time: 07/30/2021 10:36 AM  Result Value Ref Range   Prothrombin Time 13.1 11.4 - 15.2 seconds   INR 1.0 0.8 - 1.2    Comment: (NOTE) INR goal varies based on device and disease states. Performed at [facility], [address], [place], [place] [address]   APTT     Status: None   Collection Time: 07/25/2021 10:36 AM  Result Value Ref Range   aPTT 24 24 - 36 seconds    Comment: Performed at [facility], [address], [place], [place] [address]  CBC     Status: Abnormal   Collection Time: 07/25/2021 10:36 AM  Result Value Ref Range   WBC 10.5 4.0 - 10.5 K/uL   RBC 4.73 3.87 - 5.11 MIL/uL    Hemoglobin 13.2 12.0 - 15.0 g/dL   HCT 00.8 67.6 - 19.5 %   MCV 86.9 80.0 - 100.0 fL   MCH 27.9 26.0 - 34.0 pg   MCHC 32.1 30.0 - 36.0 g/dL   RDW 09.3 26.7 - 12.4 %   Platelets 413 (H) 150 - 400 K/uL   nRBC 0.0 0.0 - 0.2 %    Comment: Performed at [facility], [address], [place], [place] [address]  Differential     Status: Abnormal   Collection Time: 07/24/2021 10:36 AM  Result Value Ref Range   Neutrophils Relative % 77 %   Neutro Abs 8.1 (H) 1.7 - 7.7 K/uL   Lymphocytes Relative 15 %   Lymphs Abs 1.6 0.7 - 4.0 K/uL   Monocytes Relative 7 %   Monocytes Absolute 0.7 0.1 - 1.0 K/uL   Eosinophils Relative 0 %   Eosinophils Absolute 0.0 0.0 - 0.5 K/uL   Basophils Relative 0 %   Basophils Absolute 0.0 0.0 - 0.1 K/uL   Immature Granulocytes 1 %   Abs Immature Granulocytes 0.06 0.00 - 0.07 K/uL    Comment: Performed at [facility], [address], [place], [place] [address]  Comprehensive metabolic panel     Status: Abnormal   Collection Time: 07/22/2021 10:36 AM  Result Value Ref Range   Sodium 139 135 - 145 mmol/L   Potassium 3.8 3.5 - 5.1 mmol/L   Chloride 103 98 - 111 mmol/L   CO2 24 22 - 32 mmol/L   Glucose, Bld 141 (H) 70 - 99 mg/dL    Comment: Glucose reference range applies only to samples taken after fasting for at least 8 hours.   BUN 15 8 - 23 mg/dL   Creatinine, Ser 5.05 0.44 - 1.00 mg/dL   Calcium 9.3 8.9 - 39.7 mg/dL   Total Protein 7.0 6.5 - 8.1 g/dL   Albumin 3.3 (L) 3.5 - 5.0 g/dL   AST 20 15 - 41 U/L   ALT 11 0 - 44 U/L   Alkaline Phosphatase 73 38 - 126 U/L   Total Bilirubin 1.1 0.3 - 1.2 mg/dL   GFR, Estimated >67 >34 mL/min    Comment: (NOTE) Calculated using the CKD-EPI Creatinine Equation (2021)    Anion gap 12 5 - 15    Comment: Performed at [facility], [address], [place], [place] [address]  CBG monitoring, ED     Status: Abnormal   Collection Time: 07/22/2021 10:40 AM  Result Value Ref Range   Glucose-Capillary 131  (H) 70 - 99 mg/dL    Comment: Glucose reference range applies only to samples taken  after fasting for at least 8 hours.  I-stat chem 8, ED     Status: Abnormal   Collection Time: 2021-08-06 10:46 AM  Result Value Ref Range   Sodium 140 135 - 145 mmol/L   Potassium 3.7 3.5 - 5.1 mmol/L   Chloride 104 98 - 111 mmol/L   BUN 16 8 - 23 mg/dL   Creatinine, Ser 4.26 0.44 - 1.00 mg/dL   Glucose, Bld 834 (H) 70 - 99 mg/dL    Comment: Glucose reference range applies only to samples taken after fasting for at least 8 hours.   Calcium, Ion 1.14 (L) 1.15 - 1.40 mmol/L   TCO2 25 22 - 32 mmol/L   Hemoglobin 13.6 12.0 - 15.0 g/dL   HCT 19.6 22.2 - 97.9 %   CT HEAD CODE STROKE WO CONTRAST  Result Date: Aug 06, 2021 CLINICAL DATA:  Code stroke. 87 year old female with slurred speech. Fall 8 days ago. Left side weakness. EXAM: CT HEAD WITHOUT CONTRAST TECHNIQUE: Contiguous axial images were obtained from the base of the skull through the vertex without intravenous contrast. COMPARISON:  Brain MRI 04/30/2008.  Head CT 01/09/2021. FINDINGS: Brain: Hyperdense acute intracranial hemorrhage. There is oval intra-axial hemorrhage centered at the right thalamus encompassing 35 x 31 x 35 mm (AP by transverse by CC) for an estimated blood volume of 19 mL. Regional edema. Mild regional mass effect including trace leftward midline shift. Intraventricular extension. Moderate volume of intraventricular hemorrhage including to the 4th ventricle. And there is mild lateral ventriculomegaly, with some dilatation of the temporal horns. Mild transependymal edema suspected. Basilar cisterns remain patent. No subdural or other extra-axial blood identified. Aside from the acute transependymal and cerebral edema gray-white matter differentiation appears stable. No superimposed acute cortically based infarct identified. Vascular: Calcified atherosclerosis at the skull base. Skull: Congenital incomplete ossification of the posterior C1 ring.  Skull appears stable. No acute osseous abnormality identified. Sinuses/Orbits: Visualized paranasal sinuses and mastoids are stable and well aerated. Other: No orbit or scalp soft tissue injury identified. ASPECTS [facility] Stroke Program Early CT Score) Total score (0-10 with 10 being normal): Not applicable IMPRESSION: 1. Acute Right Thalamic Hemorrhage (19 mL) with moderate intraventricular extension of blood. Mild ventriculomegaly with transependymal edema. Regional right thalamic edema. Trace leftward midline shift. 2. These results were communicated to Dr. [name] at 10:56 am on 06-Aug-2021 by text page via the [facility] messaging system. Electronically Signed   By: Odessa Fleming M.D.   On: 08-06-2021 10:57    Pending Labs Unresulted Labs (From admission, onward)     Start     Ordered   07/27/21 0500  CBC  Tomorrow morning,   R        08/06/21 1049   07/27/21 0500  Comprehensive metabolic panel  Tomorrow morning,   R        08-06-2021 1049   August 06, 2021 1102  Sodium  Now then every 6 hours,   R (with TIMED occurrences)      08-06-21 1102   08-06-2021 1048  Urinalysis, Routine w reflex microscopic  Once,   R        August 06, 2021 1049   08-06-2021 1048  Urine rapid drug screen (hosp performed)not at [facility]  Once,   R        Aug 06, 2021 1049   August 06, 2021 1048  Type and screen [facility]  Once,   R       Comments: [facility]    2021-08-06 1049  07-31-21 1047  Ethanol  Once,   R        Jul 31, 2021 1049   2021/07/31 1047  Hemoglobin A1c  Once,   R        2021-07-31 1049            Vitals/Pain Today's Vitals   2021/07/31 1053 July 31, 2021 1056 07-31-2021 1100 07/31/21 1122  BP: (!) 147/64 (!) 154/61 (!) 150/63   Pulse: 70 69 67   Resp:   (!) 22   Temp:      TempSrc:      SpO2: 94% 93% 94% 98%  Weight:      PainSc:        Isolation Precautions No active isolations  Medications Medications  sodium chloride flush (NS) 0.9 % injection 3 mL (has no administration in time range)   labetalol (NORMODYNE) injection 20 mg (10 mg Intravenous Given 2021/07/31 1049)    And  clevidipine (CLEVIPREX) infusion 0.5 mg/mL (0 mg/hr Intravenous Stopped 2021/07/31 1122)   stroke: mapping our early stages of recovery book (has no administration in time range)  acetaminophen (TYLENOL) tablet 650 mg (has no administration in time range)    Or  acetaminophen (TYLENOL) 160 MG/5ML solution 650 mg (has no administration in time range)    Or  acetaminophen (TYLENOL) suppository 650 mg (has no administration in time range)  senna-docusate (Senokot-S) tablet 1 tablet (has no administration in time range)  pantoprazole (PROTONIX) injection 40 mg (has no administration in time range)  sodium chloride 3% (hypertonic) IV bolus 250 mL ( Intravenous Infusion Verify 07-31-21 1130)  sodium chloride (hypertonic) 3 % solution (has no administration in time range)  insulin aspart (novoLOG) injection 0-6 Units (has no administration in time range)    Mobility walks with device High fall risk   Focused Assessments Neuro Assessment Handoff:  Swallow screen pass?  no Cardiac Rhythm: Normal sinus rhythm Sinus        Neuro Assessment: Exceptions to WDL Neuro Checks:      Last Documented NIHSS Modified Score:   Has TPA been given? No If patient is a Neuro Trauma and patient is going to OR before floor call report to 4N Charge nurse: [phone] or [phone]   R Recommendations: See Admitting Provider Note  Report given to:   Additional Notes:

## 2021-07-26 NOTE — H&P (Signed)
Neurology H&P  Reason for Consult: Left-sided weakness, slurred speech Referring Physician: Dr. Karene Fry  CC: Left-sided weakness, slurred speech  History is obtained from: EMS, Chart review, patient, patient's daughter-in-law via telephone  HPI: Karen Manning is a 87 y.o. right-handed female with a medical history significant for essential hypertension, hyperlipidemia, hypercalcemia, migraine headache, GERD, anxiety, depression, osteoporosis, arthritis, recurrent UTI, peripheral neuropathy, and multiple recent falls who presented to the ED 10/9 via EMS for evaluation of left-sided weakness and slurred speech.  Patient states that she woke up at 06:00 AM at her baseline and had breakfast as she normally would.  She states that at about 08:00 AM she began to not feel well and hit her life alert button.  She was found to have left-sided weakness, slurred speech, right-sided gaze and EMS was activated for transport to the hospital.  Of note, patient reported to facility staff that she fell on Wednesday 10/5 and hit her head against her nightstand.  At this time she states that she had to crawl to the bathroom in order to get herself up.  She denies loss of consciousness but the event was unwitnessed.  At baseline, patient lives in independent living facility and walks with a Rollator.  Patient's daughter-in-law states that she has had some troubles with sciatica for which she is receiving physical therapy but due to her sciatic pain she has had some recent falls.  Family has noted some memory difficulty over the last few months and some word-finding difficulties and normal conversation but states that overall she lives independently and is able to complete self-care without assistance. She has recently been placed on tinazidine 4 mg PO TID PRN for back pain in addition to Ativan and gabapentin, and she is able to confirm she takes all of these medications, and does indeed take the tizanidine 3 times daily.    LKW: 08:00 per patient. It is unclear what time she was seen normal by another person, daughter-in-law at bedside last spoke with her at about 4 PM on 10/8. TNK given?: no, ICH IR Thrombectomy? No, ICH ICH Score: 2 1 point for age, 1 point for IVH Modified Rankin Scale: 2-Slight disability-UNABLE to perform all activities but does not need assistance  ROS: A complete ROS was performed and is negative except as noted in the HPI.   Past Medical History:  Diagnosis Date   Arthritis    hands   Arthritis of knee, degenerative 05/02/2014   BP (high blood pressure) 04/10/2015   Constipation    Essential (primary) hypertension 05/02/2014   Female bladder prolapse    Gastric ulcer 04/10/2015   Headache, migraine 04/10/2015   High blood pressure    History of kidney stones    Hypercalcemia    Kidney stone    Parkinson disease (HCC) 06/26/2013   Recurrent UTI    Past Surgical History:  Procedure Laterality Date   BLADDER SURGERY     sling   BREAST CYST ASPIRATION Left yrs ago   BREAST EXCISIONAL BIOPSY Left 1986   benign   CHOLECYSTECTOMY     KIDNEY STONE SURGERY     PARTIAL HYSTERECTOMY     Family History  Problem Relation Age of Onset   Gait disorder Sister        Stiff Persons Syndrome   Diabetes Sister    Gallbladder disease Sister    Breast cancer Sister        58's   Thrombosis Sister    Diabetes Paternal  Grandfather    Kidney disease Neg Hx    Prostate cancer Neg Hx    Bladder Cancer Neg Hx    Social History:   reports that she has never smoked. She has never used smokeless tobacco. She reports that she does not drink alcohol and does not use drugs.  Medications  Current Facility-Administered Medications:     stroke: mapping our early stages of recovery book, , Does not apply, Once, Toberman, Stevi W, NP   acetaminophen (TYLENOL) tablet 650 mg, 650 mg, Oral, Q4H PRN **OR** acetaminophen (TYLENOL) 160 MG/5ML solution 650 mg, 650 mg, Per Tube, Q4H PRN **OR**  acetaminophen (TYLENOL) suppository 650 mg, 650 mg, Rectal, Q4H PRN, Cindie Laroche, Reyne Dumas, NP   labetalol (NORMODYNE) injection 20 mg, 20 mg, Intravenous, Once **AND** clevidipine (CLEVIPREX) infusion 0.5 mg/mL, 0-21 mg/hr, Intravenous, Continuous, Linzi Ohlinger L, MD   pantoprazole (PROTONIX) injection 40 mg, 40 mg, Intravenous, QHS, Toberman, Stevi W, NP   senna-docusate (Senokot-S) tablet 1 tablet, 1 tablet, Oral, BID, Toberman, Stevi W, NP   sodium chloride flush (NS) 0.9 % injection 3 mL, 3 mL, Intravenous, Once, Gerhard Munch, MD  Current Outpatient Medications:    aspirin EC 81 MG tablet, Take by mouth., Disp: , Rfl:    cholecalciferol (VITAMIN D) 1000 UNITS tablet, Take 1,000 Units by mouth daily., Disp: , Rfl:    Cranberry 125 MG TABS, Take 1 tablet by mouth., Disp: , Rfl:    ferrous sulfate 325 (65 FE) MG tablet, Take 325 mg by mouth daily with breakfast., Disp: , Rfl:    gabapentin (NEURONTIN) 300 MG capsule, , Disp: , Rfl:    LORazepam (ATIVAN) 2 MG tablet, , Disp: , Rfl:    Multiple Vitamin (MULTIVITAMIN) tablet, Take 1 tablet by mouth daily., Disp: , Rfl:    pantoprazole (PROTONIX) 40 MG tablet, , Disp: , Rfl:    polyethylene glycol powder (GLYCOLAX/MIRALAX) powder, Reported on 12/01/2015, Disp: , Rfl:    potassium chloride SA (K-DUR,KLOR-CON) 20 MEQ tablet, , Disp: , Rfl:   Exam: Current vital signs: BP (!) 179/71 (BP Location: Right Arm)   Pulse 81   Temp 97.8 F (36.6 C) (Oral)   Resp 18   Wt 60.8 kg   SpO2 98%   BMI 23.01 kg/m  Vital signs in last 24 hours: SpO2:  [98 %] 98 % (10/09 1032) Weight:  [60.8 kg] 60.8 kg (10/09 1042)  GENERAL: Awake, alert, with clear left-sided neglect initially, progressing to very somnolent after CT scan Psych: Affect appropriate for situation, patient is calm and cooperative with examination Head: Normocephalic and atraumatic, without obvious abnormality EENT: Normal conjunctivae, dry mucous membranes, no OP obstruction LUNGS:  Normal respiratory effort. Non-labored breathing on room air CV: Regular rate and rhythm on telemetry ABDOMEN: Soft, non-tender, non-distended Extremities: warm, well perfused, without obvious deformity  NEURO:  Mental Status: Awake, alert, with clear left-sided neglect initially, progressing to very somnolent after CT scan, oriented to person, age, month She is able to provide a clear and coherent history of present illness. Speech/Language: speech is dysarthric but she is able to name and repeat as well as follow simple commands Significant neglect of the left side Cranial Nerves:  II: PERRL 3 to 1 mm / brisk. visual fields with some difficulty seeing in the left upper quadrant.  III, IV, VI: EOMI with encouragement but has a significant right gaze preference though she can bury fully to the left. Lid elevation symmetric and full.  V: Sensation is  reduced in the left face VII: Left lower facial droop VIII: Hearing intact to voice, does seem to be hard of hearing, but able to parse commands IX, X: Palate elevation is symmetric. Phonation is markedly dysarthric XII: Tongue protrudes midline without fasciculations.   Motor: Left upper extremity extensor posturing, left lower extremity triple flexion, right lower extremity is antigravity with some drift, right upper extremity has no pronator drift Sensation: Insensate in the left upper extremity, reduced sensation in the left face, reduced sensation in the left leg compared to the right side.  Extinguishes to double sided stimulation Coordination: FTN intact on the right upper extremity. HKS intact in the right lower extremity within limits of somnolence/slight weakness.  DTRs: Toes are upgoing bilaterally Gait: Deferred  NIHSS: 1a Level of Conscious: 0  1b LOC Questions: 0 1c LOC Commands: 0 2 Best Gaze: 1 3 Visual: 1 4 Facial Palsy: 2 5a Motor Arm - left: 3 5b Motor Arm - Right: 0 6a Motor Leg - Left: 3 6b Motor Leg - Right: 1 7  Limb Ataxia: 0 8 Sensory: 2 9 Best Language: 0 10 Dysarthria: 1 11 Extinct. and Inatten.: 1 TOTAL: 15  Labs I have reviewed labs in epic and the results pertinent to this consultation are: CBC    Component Value Date/Time   WBC 10.5 08/08/21 1036   RBC 4.73 08-08-21 1036   HGB 13.6 08-08-21 1046   HGB 13.4 12/19/2013 0645   HCT 40.0 Aug 08, 2021 1046   HCT 45.6 12/11/2013 1125   PLT 413 (H) 2021-08-08 1036   PLT 288 12/11/2013 1125   MCV 86.9 August 08, 2021 1036   MCV 87 12/11/2013 1125   MCH 27.9 08-08-2021 1036   MCHC 32.1 Aug 08, 2021 1036   RDW 14.3 August 08, 2021 1036   RDW 14.1 12/11/2013 1125   LYMPHSABS 1.6 2021/08/08 1036   LYMPHSABS 2.0 10/03/2012 0405   MONOABS 0.7 2021-08-08 1036   MONOABS 0.8 10/03/2012 0405   EOSABS 0.0 08-08-21 1036   EOSABS 0.2 10/03/2012 0405   BASOSABS 0.0 August 08, 2021 1036   BASOSABS 0.0 10/03/2012 0405   CMP     Component Value Date/Time   NA 140 08/08/2021 1046   NA 140 12/11/2013 1125   K 3.7 2021/08/08 1046   K 4.1 12/11/2013 1125   CL 104 08-08-21 1046   CL 107 12/11/2013 1125   CO2 27 12/11/2013 1125   GLUCOSE 142 (H) 2021/08/08 1046   GLUCOSE 96 12/11/2013 1125   BUN 16 2021-08-08 1046   BUN 18 12/11/2013 1125   CREATININE 0.80 08/08/2021 1046   CREATININE 1.01 12/11/2013 1125   CALCIUM 9.0 12/11/2013 1125   PROT 7.3 09/29/2012 1716   ALBUMIN 3.5 09/29/2012 1716   AST 24 09/29/2012 1716   ALT 18 09/29/2012 1716   ALKPHOS 68 09/29/2012 1716   BILITOT 0.3 09/29/2012 1716   GFRNONAA 52 (L) 12/11/2013 1125   GFRAA >60 12/11/2013 1125   Lipid Panel  No results found for: CHOL, TRIG, HDL, CHOLHDL, VLDL, LDLCALC, LDLDIRECT No results found for: HGBA1C  Imaging I have reviewed the images obtained:  CT-scan of the brain Code Stroke 08-Aug-2021: Acute Right Thalamic Hemorrhage (19 mL) with moderate intraventricular extension of blood. Mild ventriculomegaly with transependymal edema. Regional right thalamic edema. Trace  leftward midline shift.  Assessment: 87 year old female who presented to the ED from her independent living facility via EMS after activating her life alert button for feeling unwell and was found with left-sided weakness, slurred speech, right gaze  preference. CTH was obtained revealing an acute right thalamic hemorrhage with moderate IVH extension, mild ventriculometry with transependymal edema, regional right thalamic edema, and trace leftward midline shift.   Impression: Brainstem ICH, nontraumatic, IVH, nontraumatic  Plan: Acuity: Acute Laterality: right Current suspected etiology: hypertensive ICH Treatment: -Admit to ICU -ICH Score: 2 -ICH Volume: 19 mL -BP control goal SYS< 140 -PT/OT/ST  -neuromonitoring  CNS Concern for compression of brain Cerebral edema, regional right thalamic  Obstructive hydrocephalus secondary to intraventricular hemorrhage Intraventricular hemorrhage -NSGY consult for evaluation of possible EVD  -Close neuro monitoring -CT stability scan in 6 hours or STAT with any neurologic decline -MRI brain without contrast tomorrow   Dysarthria and likely dysphagia following ICH -NPO until cleared by speech -ST -Advance diet as tolerated  Hemiplegia and hemiparesis following nontraumatic intracerebral hemorrhage affecting left non-dominant side  -Continue PT/OT/ST  RESP -Maintain SpO2 > 94% -Supplemental oxygen as needed -Patient is DNR  CV Essential (primary) hypertension Hypertensive Emergency -Aggressive BP control, goal SBP < 140 -Labetalol PRN once followed by Cleviprex gtt PRN  -Titrate oral agents once patient is cleared for p.o., IV and drips only at this time -Echocardiogram   GI/GU -Gentle hydration  HEME AM CBC -No evidence of coagulopathy at this time -Monitor -transfuse for hgb < 7  ENDO Hemoglobin A1c pending -goal HgbA1c < 7 -q4h cbg monitoring with low-dose sliding scale insulin  Fluid/Electrolyte Disorders AM  CMP -Replete -Repeat labs -Trend  ID Trend fever and WBC curve -CXR -NPO until cleared by speech -UA pending  Nutrition E46 Protein-Calorie Malnutrition  -diet consult  Prophylaxis DVT: SCDs GI: PPI Bowel: Docusate and senna   Diet: NPO until cleared by speech  Code Status: DNR  THE FOLLOWING WERE PRESENT ON ADMISSION: CNS - ICH, Hemiparesis, Hemiplegia, IVH, hydrocephalus Respiratory -possible aspiration pneumonia Cardiovascular - Hypertensive Emergency Infectious - N/A GI - N/A Renal -  N/A Heme-  N/A Cancer - N/A Trauma - unwitnessed s/p fall and hitting head on side table on 07/21/2021 without reported LOC DNR CODE STATUS  Lanae Boast, AGAC-NP [facility] Pager: [phone]  Attending Neurologist's note:  I personally saw this patient, gathering history, performing a full neurologic examination, reviewing relevant labs, personally reviewing relevant imaging including Head CT, and formulated the assessment and plan, adding the note above for completeness and clarity to accurately reflect my thoughts   CRITICAL CARE Performed by: Gordy Councilman   Total critical care time: 90 minutes  Critical care time was exclusive of separately billable procedures and treating other patients.  Critical care was necessary to treat or prevent imminent or life-threatening deterioration.  Critical care was time spent personally by me on the following activities: development of treatment plan with patient and/or surrogate as well as nursing, discussions with consultants, evaluation of patient's response to treatment, examination of patient, obtaining history from patient or surrogate, ordering and performing treatments and interventions, ordering and review of laboratory studies, ordering and review of radiographic studies, pulse oximetry and re-evaluation of patient's condition.

## 2021-07-26 NOTE — Consult Note (Signed)
   Providing Compassionate, Quality Care - Together  Neurosurgery Consult  Referring physician: Dr. [name] Reason for referral: Intracerebral hemorrhage  Chief Complaint: Altered mental status  History of Present Illness: This is an 87 year old female with a history of hypertension, Parkinson's disease that presents with altered mental status that began this morning around 8 AM.  She does live in independent living facility.  She had significant left-sided weakness and left facial droop therefore stroke work-up was initiated.  CT brain revealed right basal ganglia hemorrhage with significant intraventricular hemorrhage and casting of the third and fourth ventricles.  Therefore I was consulted.  History reviewed. No pertinent past medical history. History reviewed. No pertinent surgical history.  Medications: I have reviewed the patient's current medications. Allergies: No Known Allergies  History reviewed. No pertinent family history. Social History:  has no history on file for tobacco use, alcohol use, and drug use.  ROS: All positives and negatives are listed HPI above  Physical Exam:  Vital signs in last 24 hours: Temp:  [98 F (36.7 C)-98.3 F (36.8 C)] 98 F (36.7 C) (07/25 1814) Pulse Rate:  [58-128] 65 (07/26 0746) Resp:  [11-18] 14 (07/26 0217) BP: (138-182)/(65-125) 153/88 (07/26 0700) SpO2:  [91 %-98 %] 96 % (07/26 0746) PE: Eyes closed PERRLA, slight rightward gaze Oriented x3, speech fluent and appropriate Left facial droop Left hemiparesis Right full strength Follows commands x4   Impression/Assessment:  87 year old female with  Right thalamic hemorrhage with IVH and mild hydrocephalus  Plan:  -Admit to ICU -SBP control -daughter at bedside, I had extensive discussion with the daughter and her husband at bedside.  I showed them the CT findings.  I explained the beginnings of hydrocephalus and likelihood that this will continue to develop.  We discussed  possible ventriculostomy if her neurologic status declined due to hydrocephalus which at this time the daughter declined and would rather let nature take its course.  She did not believe that her mother would want the ventriculostomy. -I answered all their questions.   Thank you for allowing me to participate in this patient's care.  Please do not hesitate to call with questions or concerns.   Monia Pouch, DO Neurosurgeon [facility] Cell: [phone]

## 2021-07-26 NOTE — Code Documentation (Addendum)
Stroke Response Nurse Documentation Code Documentation  Karen Manning is a 87 y.o. female arriving to [facility] ED via [facility] on 07/28/2021 with past medical hx of HTN, HLD, migraine headache, anxiety, and peripheral neuropathy. On aspirin 81 mg daily. Code stroke was activated by EMS.   Patient from an independent living facility, [facility], where she was LKW at 0800 and now complaining of left sided weakness and slurred speech. Pt woke up today at 0600 and had breakfast. At 0800 she began to not feel well and hit her life alert button. Staff found her to have left-sided weakness, slurred speech, right-sided gaze.  Patient reported that she fell on Wednesday 10/5 and hit her head against her nightstand. Event was unwitnessed, she denies loss of consciousness.   Stroke team at the bedside on patient arrival. Labs drawn and patient cleared for CT by Dr. Jeraldine Loots. Patient to CT with team. NIHSS 15, see documentation for details and code stroke times. Patient with not following commands, right gaze preference, left facial droop, left arm weakness, left leg weakness, left decreased sensation, dysarthria, and neglect on exam. The following imaging was completed: CT. Patient is not a candidate for IV Thrombolytic due to CT scan positive for bleed. Patient is not a candidate for IR.   Care/Plan:  -q1h NIHSS, VS, and pupil checks -HOB at 30 degrees -SBP goal: 140-160 per Dr. [name]   Bedside handoff with ED RN Delice Bison.    Bryon Parker L Carley Strickling  Rapid Response RN

## 2021-07-26 NOTE — Assessment & Plan Note (Addendum)
-  Patient presenting with apparent stroke with marked L-sided symptoms -CT with acute R thalamic hemorrhage with edema and trace midline shift -Neurosurgery (Dr. [name]) and neurology (Dr. [name]) have consulted and agree that this is a significant event which will likely result in demise -After discussion with Dr. [name], family has decided to proceed with comfort care only -She was admitted to [facility] neuroICU but will be transferred to palliative level of care for comfort care and palliative care consult -Anticipate in-hospital demise -Comfort care order set utilized -Pain control with dilaudid as needed, would transition to a drip if needed but patient does not appear to be in pain at this time

## 2021-07-26 NOTE — Progress Notes (Signed)
Report received from ED RN. Patient arrived to 4NICU. Initial assessment performed and documented. Patient reports wearing upper dentures, declines removing them at this time. Only belongings at bedside include clothing.

## 2021-07-27 DIAGNOSIS — Z7189 Other specified counseling: Secondary | ICD-10-CM | POA: Diagnosis not present

## 2021-07-27 DIAGNOSIS — G20 Parkinson's disease: Secondary | ICD-10-CM | POA: Diagnosis not present

## 2021-07-27 DIAGNOSIS — I629 Nontraumatic intracranial hemorrhage, unspecified: Secondary | ICD-10-CM | POA: Diagnosis not present

## 2021-07-27 DIAGNOSIS — T17908A Unspecified foreign body in respiratory tract, part unspecified causing other injury, initial encounter: Secondary | ICD-10-CM

## 2021-07-27 DIAGNOSIS — Z515 Encounter for palliative care: Secondary | ICD-10-CM | POA: Diagnosis not present

## 2021-07-27 DIAGNOSIS — R54 Age-related physical debility: Secondary | ICD-10-CM

## 2021-07-27 NOTE — Progress Notes (Signed)
PROGRESS NOTE  Karen Manning HFW:[id number] DOB: 1930-02-05 DOA: 19-Aug-2021 PCP: [name], MD  HPI/Recap of past 24 hours:  Karen Manning is an 87 y.o. female with h/o HTN and Parkinson's presenting with stroke symptoms, pressed her Life Alert.  She has L facial droop and marked L neglect with L hemiplegia.  The patient is without complaint and is currently alert and oriented to person and place.  Her daughter reports that they experienced end of life issues with the patient's husband and she is very clear that she would want comfort only.  07/27/2021: Seen and examined at bedside.  No acute distress.  On comfort care measures.    Assessment/Plan: Principal Problem:   End of life care Active Problems:   Parkinson disease (HCC)   Essential (primary) hypertension   HLD (hyperlipidemia)   ICH (intracerebral hemorrhage) (HCC)   Pressure injury of skin  *End of life care -Patient presenting with apparent stroke with marked L-sided symptoms -CT with acute R thalamic hemorrhage with edema and trace midline shift -Neurosurgery (Dr. [name]) and neurology (Dr. [name]) have consulted and agree that this is a significant event which will likely result in demise -After discussion with Dr. [name], family has decided to proceed with comfort care only -She was admitted to [facility] neuroICU but will be transferred to palliative level of care for comfort care and palliative care consult -Anticipate in-hospital demise -Comfort care order set utilized -Pain control with dilaudid as needed, would transition to a drip if needed but patient does not appear to be in pain at this time         DVT prophylaxis: None - comfort measures Code Status: DNR - confirmed with family Family Communication: None at bedside. Disposition Plan: Anticipate in-hospital death Consults called: Neurosurgery; Neurology; palliative care   Status is: Inpatient    Dispo:  Patient From: Home  Planned  Disposition: Residential Hospice  Medically stable for discharge: Yes          Objective: Vitals:   19-Aug-2021 1430 08-19-21 2000 07/27/21 0600 07/27/21 0800  BP: (!) 149/67 (!) 144/66  (!) 187/77  Pulse: 90 84 97 95  Resp: (!) 23 (!) 25 (!) 32 (!) 32  Temp:  97.6 F (36.4 C)  97.6 F (36.4 C)  TempSrc:  Oral  Oral  SpO2: 97% 94% 93% 92%  Weight:        Intake/Output Summary (Last 24 hours) at 07/27/2021 1626 Last data filed at 07/27/2021 1500 Gross per 24 hour  Intake 0 ml  Output 900 ml  Net -900 ml   Filed Weights   2021-08-19 1042  Weight: 60.8 kg    Exam:  General: 87 y.o. year-old female frail-appearing.  No acute distress. Cardiovascular: Regular rate and rhythm with no rubs or gallops.  No thyromegaly or JVD noted.   Respiratory: Clear to auscultation with no wheezes or rales.  Abdomen: Soft nontender nondistended with bowel sounds. Musculoskeletal: No lower extremity edema.   Data Reviewed: CBC: Recent Labs  Lab Aug 19, 2021 1036 2021/08/19 1046  WBC 10.5  --   NEUTROABS 8.1*  --   HGB 13.2 13.6  HCT 41.1 40.0  MCV 86.9  --   PLT 413*  --    Basic Metabolic Panel: Recent Labs  Lab 19-Aug-2021 1036 August 19, 2021 1046 08-19-2021 1317  NA 139 140 142  K 3.8 3.7  --   CL 103 104  --   CO2 24  --   --  GLUCOSE 141* 142*  --   BUN 15 16  --   CREATININE 0.87 0.80  --   CALCIUM 9.3  --   --    GFR: CrCl cannot be calculated (Unknown ideal weight.). Liver Function Tests: Recent Labs  Lab Aug 15, 2021 1036  AST 20  ALT 11  ALKPHOS 73  BILITOT 1.1  PROT 7.0  ALBUMIN 3.3*   No results for input(s): LIPASE, AMYLASE in the last 168 hours. No results for input(s): AMMONIA in the last 168 hours. Coagulation Profile: Recent Labs  Lab 15-Aug-2021 1036  INR 1.0   Cardiac Enzymes: No results for input(s): CKTOTAL, CKMB, CKMBINDEX, TROPONINI in the last 168 hours. BNP (last 3 results) No results for input(s): PROBNP in the last 8760 hours. HbA1C: Recent  Labs    Aug 15, 2021 1317  HGBA1C 5.7*   CBG: Recent Labs  Lab 2021-08-15 1040 August 15, 2021 1310  GLUCAP 131* 130*   Lipid Profile: No results for input(s): CHOL, HDL, LDLCALC, TRIG, CHOLHDL, LDLDIRECT in the last 72 hours. Thyroid Function Tests: No results for input(s): TSH, T4TOTAL, FREET4, T3FREE, THYROIDAB in the last 72 hours. Anemia Panel: No results for input(s): VITAMINB12, FOLATE, FERRITIN, TIBC, IRON, RETICCTPCT in the last 72 hours. Urine analysis:    Component Value Date/Time   APPEARANCEUR Cloudy (A) 12/01/2015 1141   GLUCOSEU Negative 12/01/2015 1141   BILIRUBINUR Negative 12/01/2015 1141   PROTEINUR Negative 12/01/2015 1141   NITRITE Negative 12/01/2015 1141   LEUKOCYTESUR 2+ (A) 12/01/2015 1141   Sepsis Labs: @LABRCNTIP (procalcitonin:4,lacticidven:4)  ) Recent Results (from the past 240 hour(s))  MRSA Next Gen by PCR, Nasal     Status: None   Collection Time: Aug 15, 2021 12:22 PM   Specimen: Nasal Mucosa; Nasal Swab  Result Value Ref Range Status   MRSA by PCR Next Gen NOT DETECTED NOT DETECTED Final    Comment: (NOTE) The GeneXpert MRSA Assay (FDA approved for NASAL specimens only), is one component of a comprehensive MRSA colonization surveillance program. It is not intended to diagnose MRSA infection nor to guide or monitor treatment for MRSA infections. Test performance is not FDA approved in patients less than 5 years old. Performed at [facility], [address], [place], [place]   Resp Panel by RT-PCR (Flu A&B, Covid) Nasal Mucosa     Status: None   Collection Time: August 15, 2021 12:22 PM   Specimen: Nasal Mucosa; Nasopharyngeal(NP) swabs in vial transport medium  Result Value Ref Range Status   SARS Coronavirus 2 by RT PCR NEGATIVE NEGATIVE Final    Comment: (NOTE) SARS-CoV-2 target nucleic acids are NOT DETECTED.  The SARS-CoV-2 RNA is generally detectable in upper respiratory specimens during the acute phase of infection. The  lowest concentration of SARS-CoV-2 viral copies this assay can detect is 138 copies/mL. A negative result does not preclude SARS-Cov-2 infection and should not be used as the sole basis for treatment or other patient management decisions. A negative result may occur with  improper specimen collection/handling, submission of specimen other than nasopharyngeal swab, presence of viral mutation(s) within the areas targeted by this assay, and inadequate number of viral copies(<138 copies/mL). A negative result must be combined with clinical observations, patient history, and epidemiological information. The expected result is Negative.  Fact Sheet for Patients:  09/25/21  Fact Sheet for Healthcare Providers:  BloggerCourse.com  This test is no t yet approved or cleared by the [place] FDA and  has been authorized for detection and/or diagnosis of SARS-CoV-2 by  FDA under an Emergency Use Authorization (EUA). This EUA will remain  in effect (meaning this test can be used) for the duration of the COVID-19 declaration under Section 564(b)(1) of the Act, 21 U.S.C.section 360bbb-3(b)(1), unless the authorization is terminated  or revoked sooner.       Influenza A by PCR NEGATIVE NEGATIVE Final   Influenza B by PCR NEGATIVE NEGATIVE Final    Comment: (NOTE) The Xpert Xpress SARS-CoV-2/FLU/RSV plus assay is intended as an aid in the diagnosis of influenza from Nasopharyngeal swab specimens and should not be used as a sole basis for treatment. Nasal washings and aspirates are unacceptable for Xpert Xpress SARS-CoV-2/FLU/RSV testing.  Fact Sheet for Patients: BloggerCourse.com  Fact Sheet for Healthcare Providers: SeriousBroker.it  This test is not yet approved or cleared by the [place] FDA and has been authorized for detection and/or diagnosis of SARS-CoV-2 by FDA under  an Emergency Use Authorization (EUA). This EUA will remain in effect (meaning this test can be used) for the duration of the COVID-19 declaration under Section 564(b)(1) of the Act, 21 U.S.C. section 360bbb-3(b)(1), unless the authorization is terminated or revoked.  Performed at [facility], [address] [place], [place] [address]       Studies: No results found.  Scheduled Meds:  Continuous Infusions:   LOS: 1 day     Darlin Drop, MD [facility] Pager [phone]  If 7PM-7AM, please contact night-coverage [email or web address] Password [email or web address] 07/27/2021, 4:26 PM

## 2021-07-27 NOTE — Progress Notes (Signed)
Daily Progress Note   Patient Name: Karen Manning       Date: 07/27/2021 DOB: 1930/09/21  Age: 87 y.o. MRN#: [id number] Attending Physician: Kayleen Memos, DO Primary Care Physician: Idelle Crouch, MD Admit Date: 08/17/2021 Length of Stay: 1 day  Reason for Consultation/Follow-up: Establishing goals of care and Terminal Care  HPI/Patient Profile:  87 y.o. female  with past medical history of essential hypertension, hyperlipidemia, hypercalcemia, migraine headache, GERD, anxiety, depression, osteoporosis, arthritis, recurrent UTI, peripheral neuropathy, and multiple recent falls admitted on 07/18/2021 with cc of left-sided weakness and slurred speech. The patient fell several days ago and hit her head on a bookcase but has done well with no symptoms since. This morning she woke around 6:00 am and was doing ok, but started not feeling well around 8:00 and hit her life alert. EMS responded and brought her to the hospital.  Found to have left facial droop and marked left-sided neglect with left hemiplegia. CT found right thalmamic hemorrhage with edema and trace midline shift. Neuro and neurosurgery consulted and feel this is sa significant event that will likely result in her demise. Family was offered ventriculostomy which they declined to transition to comfort care.   PMT was consulted for end-of-life care and support  Subjective:   Subjective: Chart Reviewed. Updates received. Patient Assessed. Created space and opportunity for patient  and family to explore thoughts and feelings regarding current medical situation.  Today's Discussion: I met with the patient and her daughter-in-law at the bedside.  Daughter-in-law shared that the patient is weaker today, voice is weaker.  More noticeable hemiplegia.  We shared agreement for no aggressive care and comfort care status.  She states the patient is wanting to get up and use the bathroom and they are trying to gently redirect her.  The patient's  daughter-in-law shares that she has been giving her pairs and pures for comfort, but feels she is likely aspirating.  She acknowledges that this is not an issue given her end-of-life status.  The patient denied significant pain, shortness of breath, nausea.  She did states she does have a bowel movement and nursing was notified to assist the patient to a bedpan.  ROS limited due to neuro status Review of Systems  Respiratory:  Negative for chest tightness.   Gastrointestinal:  Negative for abdominal pain, nausea and vomiting.   Objective:   Vital Signs:  BP (!) 187/77 (BP Location: Right Arm)   Pulse 95   Temp 97.6 F (36.4 C) (Oral)   Resp (!) 32   Wt 60.8 kg   SpO2 92%   BMI 23.01 kg/m   Physical Exam: Physical Exam Vitals and nursing note reviewed.  Constitutional:      General: She is not in acute distress.    Appearance: She is ill-appearing.     Comments: Appears weak/frail  Pulmonary:     Effort: Pulmonary effort is normal. No respiratory distress.     Breath sounds: No wheezing or rhonchi.  Abdominal:     General: Abdomen is flat.     Palpations: Abdomen is soft.  Skin:    General: Skin is warm and dry.  Neurological:     Mental Status: She is alert.    Palliative Assessment/Data: 20%   Assessment & Plan:   Impression: Present on Admission:  ICH (intracerebral hemorrhage) ([place])  Pressure injury of skin  Parkinson disease ([place])  HLD (hyperlipidemia)  Essential (primary) hypertension  53 year olf female with large  ICH, likely life-ending. Family has decided to shift to comfort care. Patient is awake and alert at this time but does appear weaker/more frail. Anticipate progressive decline in mental status with her brain bleed. Denies pain, N/V at this time. No obvious pain, dyspnea, anxiety, or agitation today.  SUMMARY OF RECOMMENDATIONS   Continued comfort care Ok to transfer for 6N for comfort care Continued spiritual care Liberalize  diet Liberalize visitation PMT will continue to follow  Code Status: DNR  Prognosis: Hours - Days  Discharge Planning: Anticipated Hospital Death  Discussed with: Patient's family, nursing team  Thank you for allowing [place] to participate in the care of Karen Manning PMT will continue to support holistically.  Time Total: 35 min  Visit consisted of counseling and education dealing with the complex and emotionally intense issues of symptom management and palliative care in the setting of serious and potentially life-threatening illness. Greater than 50%  of this time was spent counseling and coordinating care related to the above assessment and plan.  Walden Field, NP Palliative Medicine Team  Team Phone # [phone] (Nights/Weekends)  06/16/2021, 8:17 AM

## 2021-07-27 NOTE — Progress Notes (Signed)
This chaplain responded to PMT consult for EOL spiritual care.  The chaplain understands the Pt. is on the bed pan and wishes to decline a visit at this time.  This chaplain will attempt a visit later today.

## 2021-07-28 DIAGNOSIS — R0682 Tachypnea, not elsewhere classified: Secondary | ICD-10-CM

## 2021-07-28 DIAGNOSIS — Z515 Encounter for palliative care: Secondary | ICD-10-CM | POA: Diagnosis not present

## 2021-07-28 MED ORDER — HYDROMORPHONE BOLUS VIA INFUSION
0.2500 mg | INTRAVENOUS | Status: DC | PRN
  Filled 2021-07-28: qty 1

## 2021-07-28 MED ORDER — GLYCOPYRROLATE 0.2 MG/ML IJ SOLN
0.2000 mg | INTRAMUSCULAR | Status: DC
  Administered 2021-07-28 – 2021-07-29 (×3): 0.2 mg via INTRAVENOUS
  Filled 2021-07-28 (×4): qty 1

## 2021-07-28 MED ORDER — HYDROMORPHONE HCL PF 10 MG/ML IJ SOLN
0.5000 mg/h | INTRAMUSCULAR | Status: DC
  Administered 2021-07-28: 0.5 mg/h via INTRAVENOUS
  Filled 2021-07-28: qty 5

## 2021-07-28 NOTE — Progress Notes (Signed)
This chaplain is present with the Pt. and daughter in law-Wendy for EOL spiritual care.   The Pt. is resting comfortably as Toniann Fail shares stories that add character and color to the Pt. life. The chaplain understands acts of service and family tradition define the Pt. quality of life. The chaplain understands the Pt. involvement in her church choir is a piece of community in her faith and God's love.   The chaplain's invitation for prayer and F/U spiritual care was accepted.

## 2021-07-28 NOTE — Progress Notes (Addendum)
Daily Progress Note   Patient Name: Karen Manning       Date: 07/28/2021 DOB: 1930/01/04  Age: 87 y.o. MRN#: [id number] Attending Physician: Kayleen Memos, DO Primary Care Physician: Idelle Crouch, MD Admit Date: 07/21/2021 Length of Stay: 2 days  Reason for Consultation/Follow-up: Establishing goals of care and Terminal Care  HPI/Patient Profile:  87 y.o. female  with past medical history of essential hypertension, hyperlipidemia, hypercalcemia, migraine headache, GERD, anxiety, depression, osteoporosis, arthritis, recurrent UTI, peripheral neuropathy, and multiple recent falls admitted on 08/01/2021 with cc of left-sided weakness and slurred speech. The patient fell several days ago and hit her head on a bookcase but has done well with no symptoms since. This morning she woke around 6:00 am and was doing ok, but started not feeling well around 8:00 and hit her life alert. EMS responded and brought her to the hospital.  Found to have left facial droop and marked left-sided neglect with left hemiplegia. CT found right thalmamic hemorrhage with edema and trace midline shift. Neuro and neurosurgery consulted and feel this is sa significant event that will likely result in her demise. Family was offered ventriculostomy which they declined to transition to comfort care.   PMT was consulted for end-of-life care and support  Subjective:   Subjective: Chart Reviewed. Updates received. Patient Assessed. Created space and opportunity for patient  and family to explore thoughts and feelings regarding current medical situation.  Today's Discussion: I met with the patient and her daughter-in-law at the bedside. Patient is essentially unresponsive at this time.  I had extensive conversation with the patient's daughter-in-law Karen Manning).  She indicates the patient does not appear to be uncomfortable.  We did both recognize and appreciate that she seems a bit tachypneic today and I recommended starting a pain  drip, she agreed.  Further discussions sharing stories about the patient and her history with the patient and her marriage to the patient's son, who passed away somewhat recently.  Provided general and emotional support through therapeutic listening.  Answered all questions, addressed all concerns.  Review of Systems  Unable to perform ROS: Patient unresponsive   Objective:   Vital Signs:  BP (!) 189/78 (BP Location: Right Arm)   Pulse 98   Temp 99.4 F (37.4 C) (Axillary)   Resp 20   Wt 60.8 kg   SpO2 90%   BMI 23.01 kg/m   Physical Exam: Physical Exam Vitals and nursing note reviewed.  Constitutional:      General: She is not in acute distress.    Appearance: She is ill-appearing.  HENT:     Head: Normocephalic and atraumatic.  Pulmonary:     Effort: Tachypnea present.  Abdominal:     General: Abdomen is flat.  Skin:    General: Skin is warm and dry.    Palliative Assessment/Data: 10%   Assessment & Plan:   Impression: Present on Admission:  ICH (intracerebral hemorrhage) ([place])  Pressure injury of skin  Parkinson disease ([place])  HLD (hyperlipidemia)  Essential (primary) hypertension  54 year olf female with large ICH, likely life-ending. Family has decided to shift to comfort care. Patient is awake and alert at this time but does appear weaker/more frail. Anticipate progressive decline in mental status with her brain bleed. Denies pain, N/V at this time. No obvious pain, dyspnea, anxiety, or agitation today.  SUMMARY OF RECOMMENDATIONS   Continued comfort care Continued spiritual care PMT will continue to follow  Code Status: DNR  Prognosis: Hours -  Days  Discharge Planning: Anticipated Hospital Death  Discussed with: Medical Team, nursing team, patient's family  Thank you for allowing [place] to participate in the care of CHARLANE WESTRY PMT will continue to support holistically.  Time Total: 35 min  Visit consisted of counseling and education dealing  with the complex and emotionally intense issues of symptom management and palliative care in the setting of serious and potentially life-threatening illness. Greater than 50%  of this time was spent counseling and coordinating care related to the above assessment and plan.  Walden Field, NP Palliative Medicine Team  Team Phone # [phone] (Nights/Weekends)  06/16/2021, 8:17 AM

## 2021-07-28 NOTE — Progress Notes (Addendum)
PROGRESS NOTE  Karen Manning ZMO:[id number] DOB: 04-07-1930 DOA: August 01, 2021 PCP: [name], MD  HPI/Recap of past 24 hours: Karen Manning is an 87 y.o. female with h/o HTN and Parkinson's presenting with stroke symptoms, pressed her Life Alert.  She presented to [facility] ED via EMS for evaluation of left-sided weakness and slurred speech.  At baseline, patient lives in a independent living facility and walks with a Rollator.  Upon presentation to the ED CT head was obtained and revealed an acute right thalamic hemorrhage with moderate IVH extension, mild ventriculometry with transependymal edema, regional right thalamic edema and trace leftward midline shift.  Was seen by neurology/stroke team and neurosurgery.  Goals of care discussion held with neurology and patient's family.  Family made decision for comfort care only.  07/28/2021: Seen at bedside.  Resting calmly in the room.  Daughter-in-law at bedside.    Assessment/Plan: Principal Problem:   End of life care Active Problems:   Parkinson disease (HCC)   Essential (primary) hypertension   HLD (hyperlipidemia)   ICH (intracerebral hemorrhage) (HCC)   Pressure injury of skin  End of life care -Patient presented with left-sided weakness and slurred speech. -CT with acute R thalamic hemorrhage with edema and trace midline shift -Neurosurgery (Dr. [name]) and neurology (Dr. [name]) have consulted and agree that this is a significant event which will likely result in demise -After discussion with Dr. [name], family has decided to proceed with comfort care only -She was admitted to [facility] neuroICU, transferred to palliative level of care for comfort care and palliative care consult. -Anticipate in-hospital demise -Comfort care order set utilized -Pain control with dilaudid as needed, would transition to a drip if needed but patient does not appear to be in pain at this time.         DVT prophylaxis: None - comfort measures Code  Status: DNR - confirmed with family Family Communication: Daughter in Social worker at bedside. Disposition Plan: Anticipate in-hospital death Consults called: Neurosurgery; Neurology; palliative care   Status is: Inpatient    Dispo:  Patient From: Home  Planned Disposition: Anticipate in hospital death.  Medically stable for discharge: Yes          Objective: Vitals:   2021-08-01 2000 07/27/21 0600 07/27/21 0800 07/28/21 0500  BP: (!) 144/66  (!) 187/77 (!) 189/78  Pulse: 84 97 95 98  Resp: (!) 25 (!) 32 (!) 32 20  Temp: 97.6 F (36.4 C)  97.6 F (36.4 C) 99.4 F (37.4 C)  TempSrc: Oral  Oral Axillary  SpO2: 94% 93% 92% 90%  Weight:        Intake/Output Summary (Last 24 hours) at 07/28/2021 1330 Last data filed at 07/28/2021 0500 Gross per 24 hour  Intake --  Output 275 ml  Net -275 ml   Filed Weights   2021/08/01 1042  Weight: 60.8 kg    Exam: No significant changes from prior exam.  General: 87 y.o. year-old female frail-appearing.  No acute distress. Cardiovascular: Regular rate and rhythm with no rubs or gallops.  No thyromegaly or JVD noted.   Respiratory: Clear to auscultation with no wheezes or rales.  Abdomen: Soft nontender nondistended with bowel sounds. Musculoskeletal: Trace lower extremity edema.   Data Reviewed: CBC: Recent Labs  Lab 01-Aug-2021 1036 Aug 01, 2021 1046  WBC 10.5  --   NEUTROABS 8.1*  --   HGB 13.2 13.6  HCT 41.1 40.0  MCV 86.9  --   PLT 413*  --  Basic Metabolic Panel: Recent Labs  Lab 20-Aug-2021 1036 08-20-2021 1046 20-Aug-2021 1317  NA 139 140 142  K 3.8 3.7  --   CL 103 104  --   CO2 24  --   --   GLUCOSE 141* 142*  --   BUN 15 16  --   CREATININE 0.87 0.80  --   CALCIUM 9.3  --   --    GFR: CrCl cannot be calculated (Unknown ideal weight.). Liver Function Tests: Recent Labs  Lab 08-20-21 1036  AST 20  ALT 11  ALKPHOS 73  BILITOT 1.1  PROT 7.0  ALBUMIN 3.3*   No results for input(s): LIPASE, AMYLASE in the last  168 hours. No results for input(s): AMMONIA in the last 168 hours. Coagulation Profile: Recent Labs  Lab 08/20/2021 1036  INR 1.0   Cardiac Enzymes: No results for input(s): CKTOTAL, CKMB, CKMBINDEX, TROPONINI in the last 168 hours. BNP (last 3 results) No results for input(s): PROBNP in the last 8760 hours. HbA1C: Recent Labs    08/20/21 1317  HGBA1C 5.7*   CBG: Recent Labs  Lab 08-20-21 1040 2021/08/20 1310  GLUCAP 131* 130*   Lipid Profile: No results for input(s): CHOL, HDL, LDLCALC, TRIG, CHOLHDL, LDLDIRECT in the last 72 hours. Thyroid Function Tests: No results for input(s): TSH, T4TOTAL, FREET4, T3FREE, THYROIDAB in the last 72 hours. Anemia Panel: No results for input(s): VITAMINB12, FOLATE, FERRITIN, TIBC, IRON, RETICCTPCT in the last 72 hours. Urine analysis:    Component Value Date/Time   APPEARANCEUR Cloudy (A) 12/01/2015 1141   GLUCOSEU Negative 12/01/2015 1141   BILIRUBINUR Negative 12/01/2015 1141   PROTEINUR Negative 12/01/2015 1141   NITRITE Negative 12/01/2015 1141   LEUKOCYTESUR 2+ (A) 12/01/2015 1141   Sepsis Labs: @LABRCNTIP (procalcitonin:4,lacticidven:4)  ) Recent Results (from the past 240 hour(s))  MRSA Next Gen by PCR, Nasal     Status: None   Collection Time: 08/20/21 12:22 PM   Specimen: Nasal Mucosa; Nasal Swab  Result Value Ref Range Status   MRSA by PCR Next Gen NOT DETECTED NOT DETECTED Final    Comment: (NOTE) The GeneXpert MRSA Assay (FDA approved for NASAL specimens only), is one component of a comprehensive MRSA colonization surveillance program. It is not intended to diagnose MRSA infection nor to guide or monitor treatment for MRSA infections. Test performance is not FDA approved in patients less than 75 years old. Performed at [facility], [address], [place], [place]   Resp Panel by RT-PCR (Flu A&B, Covid) Nasal Mucosa     Status: None   Collection Time: 2021/08/20 12:22 PM   Specimen: Nasal Mucosa;  Nasopharyngeal(NP) swabs in vial transport medium  Result Value Ref Range Status   SARS Coronavirus 2 by RT PCR NEGATIVE NEGATIVE Final    Comment: (NOTE) SARS-CoV-2 target nucleic acids are NOT DETECTED.  The SARS-CoV-2 RNA is generally detectable in upper respiratory specimens during the acute phase of infection. The lowest concentration of SARS-CoV-2 viral copies this assay can detect is 138 copies/mL. A negative result does not preclude SARS-Cov-2 infection and should not be used as the sole basis for treatment or other patient management decisions. A negative result may occur with  improper specimen collection/handling, submission of specimen other than nasopharyngeal swab, presence of viral mutation(s) within the areas targeted by this assay, and inadequate number of viral copies(<138 copies/mL). A negative result must be combined with clinical observations, patient history, and epidemiological information. The expected result is Negative.  Fact Sheet for Patients:  BloggerCourse.com  Fact Sheet for Healthcare Providers:  SeriousBroker.it  This test is no t yet approved or cleared by the [place] FDA and  has been authorized for detection and/or diagnosis of SARS-CoV-2 by FDA under an Emergency Use Authorization (EUA). This EUA will remain  in effect (meaning this test can be used) for the duration of the COVID-19 declaration under Section 564(b)(1) of the Act, 21 U.S.C.section 360bbb-3(b)(1), unless the authorization is terminated  or revoked sooner.       Influenza A by PCR NEGATIVE NEGATIVE Final   Influenza B by PCR NEGATIVE NEGATIVE Final    Comment: (NOTE) The Xpert Xpress SARS-CoV-2/FLU/RSV plus assay is intended as an aid in the diagnosis of influenza from Nasopharyngeal swab specimens and should not be used as a sole basis for treatment. Nasal washings and aspirates are unacceptable for Xpert Xpress  SARS-CoV-2/FLU/RSV testing.  Fact Sheet for Patients: BloggerCourse.com  Fact Sheet for Healthcare Providers: SeriousBroker.it  This test is not yet approved or cleared by the [place] FDA and has been authorized for detection and/or diagnosis of SARS-CoV-2 by FDA under an Emergency Use Authorization (EUA). This EUA will remain in effect (meaning this test can be used) for the duration of the COVID-19 declaration under Section 564(b)(1) of the Act, 21 U.S.C. section 360bbb-3(b)(1), unless the authorization is terminated or revoked.  Performed at [facility] Lab, [address] [place], [place] [address]       Studies: No results found.  Scheduled Meds:  glycopyrrolate  0.2 mg Intravenous Q4H    Continuous Infusions:  HYDROmorphone 0.5 mg/hr (07/28/21 1106)     LOS: 2 days     Darlin Drop, MD [facility] Pager [phone]  If 7PM-7AM, please contact night-coverage [email or web address] Password [email or web address] 07/28/2021, 1:30 PM

## 2021-07-29 DIAGNOSIS — Z515 Encounter for palliative care: Secondary | ICD-10-CM | POA: Diagnosis not present

## 2021-08-18 NOTE — Discharge Summary (Signed)
Death Summary  Karen Manning:[id number] DOB: 1930/10/15 DOA: 2021-08-15  PCP: [name], MD  Admit date: 2021/08/15 Date of Death: Aug 18, 2021 Time of Death: 0518 Notification: [name], MD notified of death of 2021-08-18.   History of present illness:  Karen Manning is an 87 y.o. female with h/o HTN and Parkinson's disease who presented to [facility] ED via EMS for evaluation of left-sided weakness and slurred speech.  At baseline, patient lives in an independent living facility and walks with a Rollator.  Upon presentation to the ED CT head was obtained and revealed an acute right thalamic hemorrhage with moderate IVH extension, mild ventriculometry with transependymal edema, regional right thalamic edema and trace leftward midline shift.  Was seen by neurology/stroke team and neurosurgery.  Goals of care discussion held with neurology and patient's family.  Family made decision for comfort care only.  Comfort measure orders were applied.   On 08/18/21 at 05:18 AM, Karen Manning expired.  Final Diagnoses:  1.   End of life care -Patient presented with left-sided weakness and slurred speech. -CT with acute R thalamic hemorrhage with edema and trace midline shift -Neurosurgery (Dr. [name]) and neurology (Dr. [name]) consulted  -After discussion with Dr. [name], family made decision to proceed with comfort care only -She was initially admitted to [facility] neuroICU, then later she was transferred to palliative level of care. -Comfort care orders were utilized   The results of significant diagnostics from this hospitalization (including imaging, microbiology, ancillary and laboratory) are listed below for reference.    Significant Diagnostic Studies: CT HEAD CODE STROKE WO CONTRAST  Result Date: 08-15-21 CLINICAL DATA:  Code stroke. 87 year old female with slurred speech. Fall 8 days ago. Left side weakness. EXAM: CT HEAD WITHOUT CONTRAST TECHNIQUE: Contiguous axial images  were obtained from the base of the skull through the vertex without intravenous contrast. COMPARISON:  Brain MRI 04/30/2008.  Head CT 01/09/2021. FINDINGS: Brain: Hyperdense acute intracranial hemorrhage. There is oval intra-axial hemorrhage centered at the right thalamus encompassing 35 x 31 x 35 mm (AP by transverse by CC) for an estimated blood volume of 19 mL. Regional edema. Mild regional mass effect including trace leftward midline shift. Intraventricular extension. Moderate volume of intraventricular hemorrhage including to the 4th ventricle. And there is mild lateral ventriculomegaly, with some dilatation of the temporal horns. Mild transependymal edema suspected. Basilar cisterns remain patent. No subdural or other extra-axial blood identified. Aside from the acute transependymal and cerebral edema gray-white matter differentiation appears stable. No superimposed acute cortically based infarct identified. Vascular: Calcified atherosclerosis at the skull base. Skull: Congenital incomplete ossification of the posterior C1 ring. Skull appears stable. No acute osseous abnormality identified. Sinuses/Orbits: Visualized paranasal sinuses and mastoids are stable and well aerated. Other: No orbit or scalp soft tissue injury identified. ASPECTS [facility] Stroke Program Early CT Score) Total score (0-10 with 10 being normal): Not applicable IMPRESSION: 1. Acute Right Thalamic Hemorrhage (19 mL) with moderate intraventricular extension of blood. Mild ventriculomegaly with transependymal edema. Regional right thalamic edema. Trace leftward midline shift. 2. These results were communicated to Dr. [name] at 10:56 am on 2021-08-15 by text page via the [facility] messaging system. Electronically Signed   By: Odessa Fleming M.D.   On: 15-Aug-2021 10:57    Microbiology: Recent Results (from the past 240 hour(s))  MRSA Next Gen by PCR, Nasal     Status: None   Collection Time: 15-Aug-2021 12:22 PM   Specimen: Nasal Mucosa; Nasal Swab  Result Value Ref Range Status   MRSA by PCR Next Gen NOT DETECTED NOT DETECTED Final    Comment: (NOTE) The GeneXpert MRSA Assay (FDA approved for NASAL specimens only), is one component of a comprehensive MRSA colonization surveillance program. It is not intended to diagnose MRSA infection nor to guide or monitor treatment for MRSA infections. Test performance is not FDA approved in patients less than 2 years old. Performed at [facility], [address] [place], [place] [address]   Resp Panel by RT-PCR (Flu A&B, Covid) Nasal Mucosa     Status: None   Collection Time: 08/02/2021 12:22 PM   Specimen: Nasal Mucosa; Nasopharyngeal(NP) swabs in vial transport medium  Result Value Ref Range Status   SARS Coronavirus 2 by RT PCR NEGATIVE NEGATIVE Final    Comment: (NOTE) SARS-CoV-2 target nucleic acids are NOT DETECTED.  The SARS-CoV-2 RNA is generally detectable in upper respiratory specimens during the acute phase of infection. The lowest concentration of SARS-CoV-2 viral copies this assay can detect is 138 copies/mL. A negative result does not preclude SARS-Cov-2 infection and should not be used as the sole basis for treatment or other patient management decisions. A negative result may occur with  improper specimen collection/handling, submission of specimen other than nasopharyngeal swab, presence of viral mutation(s) within the areas targeted by this assay, and inadequate number of viral copies(<138 copies/mL). A negative result must be combined with clinical observations, patient history, and epidemiological information. The expected result is Negative.  Fact Sheet for Patients:  BloggerCourse.com  Fact Sheet for Healthcare Providers:  SeriousBroker.it  This test is no t yet approved or cleared by the [place] FDA and  has been authorized for detection and/or diagnosis of SARS-CoV-2 by FDA under an Emergency Use  Authorization (EUA). This EUA will remain  in effect (meaning this test can be used) for the duration of the COVID-19 declaration under Section 564(b)(1) of the Act, 21 U.S.C.section 360bbb-3(b)(1), unless the authorization is terminated  or revoked sooner.       Influenza A by PCR NEGATIVE NEGATIVE Final   Influenza B by PCR NEGATIVE NEGATIVE Final    Comment: (NOTE) The Xpert Xpress SARS-CoV-2/FLU/RSV plus assay is intended as an aid in the diagnosis of influenza from Nasopharyngeal swab specimens and should not be used as a sole basis for treatment. Nasal washings and aspirates are unacceptable for Xpert Xpress SARS-CoV-2/FLU/RSV testing.  Fact Sheet for Patients: BloggerCourse.com  Fact Sheet for Healthcare Providers: SeriousBroker.it  This test is not yet approved or cleared by the [place] FDA and has been authorized for detection and/or diagnosis of SARS-CoV-2 by FDA under an Emergency Use Authorization (EUA). This EUA will remain in effect (meaning this test can be used) for the duration of the COVID-19 declaration under Section 564(b)(1) of the Act, 21 U.S.C. section 360bbb-3(b)(1), unless the authorization is terminated or revoked.  Performed at [facility], [address], [place], [place] [address]      Labs: Basic Metabolic Panel: Recent Labs  Lab 08/05/2021 1036 07/22/2021 1046 07/25/2021 1317  NA 139 140 142  K 3.8 3.7  --   CL 103 104  --   CO2 24  --   --   GLUCOSE 141* 142*  --   BUN 15 16  --   CREATININE 0.87 0.80  --   CALCIUM 9.3  --   --    Liver Function Tests: Recent Labs  Lab 08/11/2021 1036  AST  20  ALT 11  ALKPHOS 73  BILITOT 1.1  PROT 7.0  ALBUMIN 3.3*   No results for input(s): LIPASE, AMYLASE in the last 168 hours. No results for input(s): AMMONIA in the last 168 hours. CBC: Recent Labs  Lab 08/24/21 1036 08/24/21 1046  WBC 10.5  --   NEUTROABS 8.1*  --   HGB  13.2 13.6  HCT 41.1 40.0  MCV 86.9  --   PLT 413*  --    Cardiac Enzymes: No results for input(s): CKTOTAL, CKMB, CKMBINDEX, TROPONINI in the last 168 hours. D-Dimer No results for input(s): DDIMER in the last 72 hours. BNP: Invalid input(s): POCBNP CBG: Recent Labs  Lab 08/24/21 1040 2021/08/24 1310  GLUCAP 131* 130*   Anemia work up No results for input(s): VITAMINB12, FOLATE, FERRITIN, TIBC, IRON, RETICCTPCT in the last 72 hours. Urinalysis    Component Value Date/Time   APPEARANCEUR Cloudy (A) 12/01/2015 1141   GLUCOSEU Negative 12/01/2015 1141   BILIRUBINUR Negative 12/01/2015 1141   PROTEINUR Negative 12/01/2015 1141   NITRITE Negative 12/01/2015 1141   LEUKOCYTESUR 2+ (A) 12/01/2015 1141   Sepsis Labs Invalid input(s): PROCALCITONIN,  WBC,  LACTICIDVEN     SIGNED:  Darlin Drop, MD  [facility] 07/31/2021, 4:31 PM Pager   If 7PM-7AM, please contact night-coverage [email or web address] Password [email or web address]

## 2021-08-18 NOTE — Progress Notes (Signed)
    OVERNIGHT PROGRESS REPORT  Notified by RN that patient has expired at  0518  Patient was comfort care/DNR followed by Palliative  2 RN verified.  Family notified by RN.     Chinita Greenland MSNA ACNPC-AG Acute Care Nurse Practitioner [facility]

## 2021-08-18 DEATH — deceased
